# Patient Record
Sex: Female | Born: 1970 | Race: Black or African American | Hispanic: No | Marital: Married | Smoking: Never smoker
Health system: Southern US, Community
[De-identification: ages and names within clinical notes are randomized; demographics above are authoritative.]

## PROBLEM LIST (undated history)

## (undated) DIAGNOSIS — I10 Essential (primary) hypertension: Secondary | ICD-10-CM

## (undated) DIAGNOSIS — D649 Anemia, unspecified: Secondary | ICD-10-CM

## (undated) HISTORY — PX: UTERINE FIBROID SURGERY: SHX826

## (undated) HISTORY — PX: EXPLORATORY LAPAROTOMY: SUR591

## (undated) HISTORY — DX: Anemia, unspecified: D64.9

## (undated) HISTORY — PX: OVARIAN CYST REMOVAL: SHX89

---

## 1992-10-16 HISTORY — PX: OTHER SURGICAL HISTORY: SHX169

## 2000-06-08 ENCOUNTER — Emergency Department (HOSPITAL_COMMUNITY): Admission: EM | Admit: 2000-06-08 | Discharge: 2000-06-08 | Payer: Self-pay | Admitting: Emergency Medicine

## 2002-09-17 ENCOUNTER — Other Ambulatory Visit: Admission: RE | Admit: 2002-09-17 | Discharge: 2002-09-17 | Payer: Self-pay | Admitting: Gynecology

## 2002-10-07 ENCOUNTER — Ambulatory Visit (HOSPITAL_COMMUNITY): Admission: RE | Admit: 2002-10-07 | Discharge: 2002-10-07 | Payer: Self-pay | Admitting: Obstetrics & Gynecology

## 2002-10-07 ENCOUNTER — Encounter: Payer: Self-pay | Admitting: Obstetrics & Gynecology

## 2002-11-25 ENCOUNTER — Encounter: Payer: Self-pay | Admitting: Obstetrics & Gynecology

## 2002-11-25 ENCOUNTER — Ambulatory Visit (HOSPITAL_COMMUNITY): Admission: RE | Admit: 2002-11-25 | Discharge: 2002-11-25 | Payer: Self-pay | Admitting: Obstetrics & Gynecology

## 2002-12-07 ENCOUNTER — Inpatient Hospital Stay (HOSPITAL_COMMUNITY): Admission: AD | Admit: 2002-12-07 | Discharge: 2002-12-07 | Payer: Self-pay | Admitting: Obstetrics

## 2002-12-11 ENCOUNTER — Encounter: Payer: Self-pay | Admitting: Obstetrics & Gynecology

## 2002-12-11 ENCOUNTER — Ambulatory Visit (HOSPITAL_COMMUNITY): Admission: RE | Admit: 2002-12-11 | Discharge: 2002-12-11 | Payer: Self-pay | Admitting: Obstetrics & Gynecology

## 2003-02-24 ENCOUNTER — Ambulatory Visit (HOSPITAL_COMMUNITY): Admission: RE | Admit: 2003-02-24 | Discharge: 2003-02-24 | Payer: Self-pay | Admitting: Obstetrics & Gynecology

## 2003-02-24 ENCOUNTER — Encounter: Payer: Self-pay | Admitting: Obstetrics & Gynecology

## 2003-04-17 ENCOUNTER — Inpatient Hospital Stay (HOSPITAL_COMMUNITY): Admission: RE | Admit: 2003-04-17 | Discharge: 2003-04-20 | Payer: Self-pay | Admitting: Obstetrics & Gynecology

## 2003-04-21 ENCOUNTER — Encounter: Admission: RE | Admit: 2003-04-21 | Discharge: 2003-05-21 | Payer: Self-pay | Admitting: Obstetrics & Gynecology

## 2003-05-22 ENCOUNTER — Encounter: Admission: RE | Admit: 2003-05-22 | Discharge: 2003-06-21 | Payer: Self-pay | Admitting: Obstetrics & Gynecology

## 2003-06-22 ENCOUNTER — Encounter: Admission: RE | Admit: 2003-06-22 | Discharge: 2003-07-22 | Payer: Self-pay | Admitting: Obstetrics & Gynecology

## 2003-08-22 ENCOUNTER — Encounter: Admission: RE | Admit: 2003-08-22 | Discharge: 2003-09-21 | Payer: Self-pay | Admitting: Obstetrics & Gynecology

## 2003-10-22 ENCOUNTER — Encounter: Admission: RE | Admit: 2003-10-22 | Discharge: 2003-11-21 | Payer: Self-pay | Admitting: Obstetrics & Gynecology

## 2003-11-22 ENCOUNTER — Encounter: Admission: RE | Admit: 2003-11-22 | Discharge: 2003-12-22 | Payer: Self-pay | Admitting: Obstetrics & Gynecology

## 2005-12-25 ENCOUNTER — Ambulatory Visit (HOSPITAL_COMMUNITY): Admission: RE | Admit: 2005-12-25 | Discharge: 2005-12-25 | Payer: Self-pay | Admitting: Obstetrics & Gynecology

## 2007-12-02 ENCOUNTER — Inpatient Hospital Stay (HOSPITAL_COMMUNITY): Admission: AD | Admit: 2007-12-02 | Discharge: 2007-12-02 | Payer: Self-pay | Admitting: Obstetrics & Gynecology

## 2008-11-03 DIAGNOSIS — R109 Unspecified abdominal pain: Secondary | ICD-10-CM | POA: Insufficient documentation

## 2008-11-03 DIAGNOSIS — G43909 Migraine, unspecified, not intractable, without status migrainosus: Secondary | ICD-10-CM | POA: Insufficient documentation

## 2008-11-04 ENCOUNTER — Ambulatory Visit: Payer: Self-pay | Admitting: Gastroenterology

## 2008-11-04 DIAGNOSIS — R1033 Periumbilical pain: Secondary | ICD-10-CM | POA: Insufficient documentation

## 2008-11-05 ENCOUNTER — Ambulatory Visit: Payer: Self-pay | Admitting: Internal Medicine

## 2008-11-05 ENCOUNTER — Telehealth (INDEPENDENT_AMBULATORY_CARE_PROVIDER_SITE_OTHER): Payer: Self-pay | Admitting: *Deleted

## 2008-11-05 LAB — CONVERTED CEMR LAB
AST: 20 units/L (ref 0–37)
Albumin: 3.5 g/dL (ref 3.5–5.2)
Alkaline Phosphatase: 46 units/L (ref 39–117)
BUN: 10 mg/dL (ref 6–23)
Eosinophils Absolute: 0.1 10*3/uL (ref 0.0–0.7)
Eosinophils Relative: 1.3 % (ref 0.0–5.0)
GFR calc non Af Amer: 100 mL/min
Glucose, Bld: 104 mg/dL — ABNORMAL HIGH (ref 70–99)
HCT: 34.6 % — ABNORMAL LOW (ref 36.0–46.0)
Hemoglobin: 11.2 g/dL — ABNORMAL LOW (ref 12.0–15.0)
MCV: 79.5 fL (ref 78.0–100.0)
Monocytes Absolute: 0.4 10*3/uL (ref 0.1–1.0)
Monocytes Relative: 8.2 % (ref 3.0–12.0)
Neutro Abs: 2.9 10*3/uL (ref 1.4–7.7)
Platelets: 252 10*3/uL (ref 150–400)
Potassium: 4.1 meq/L (ref 3.5–5.1)
RDW: 15 % — ABNORMAL HIGH (ref 11.5–14.6)
Sed Rate: 31 mm/hr — ABNORMAL HIGH (ref 0–22)
Total Bilirubin: 0.6 mg/dL (ref 0.3–1.2)
WBC: 5.3 10*3/uL (ref 4.5–10.5)
hCG, Beta Chain, Quant, S: <0.5 milliintl units/mL

## 2008-11-16 ENCOUNTER — Telehealth: Payer: Self-pay | Admitting: Gastroenterology

## 2008-12-04 ENCOUNTER — Inpatient Hospital Stay (HOSPITAL_COMMUNITY): Admission: RE | Admit: 2008-12-04 | Discharge: 2008-12-07 | Payer: Self-pay | Admitting: Obstetrics & Gynecology

## 2008-12-04 ENCOUNTER — Encounter: Payer: Self-pay | Admitting: Obstetrics & Gynecology

## 2010-11-06 ENCOUNTER — Encounter: Payer: Self-pay | Admitting: Obstetrics & Gynecology

## 2011-01-31 LAB — CBC
Hemoglobin: 12 g/dL (ref 12.0–15.0)
RBC: 4.6 MIL/uL (ref 3.87–5.11)
WBC: 7 10*3/uL (ref 4.0–10.5)

## 2011-01-31 LAB — PREGNANCY, URINE: Preg Test, Ur: NEGATIVE

## 2011-02-28 NOTE — Op Note (Signed)
Shelby Hodges, Shelby Hodges         ACCOUNT NO.:  [id number]   MEDICAL RECORD NO.:  [id number]          PATIENT TYPE:  OIB   LOCATION:  9304                          FACILITY:  [facility]   PHYSICIAN:  Roseanna Rainbow, M.D.DATE OF BIRTH:  1971-03-02   DATE OF PROCEDURE:  DATE OF DISCHARGE:                               OPERATIVE REPORT   PREOPERATIVE DIAGNOSIS:  Right ovarian cyst.   POSTOPERATIVE DIAGNOSES:  Right paratubal cyst, pelvic adhesions.   PROCEDURE:  Diagnostic laparoscopy, exploratory laparotomy with lysis of  adhesions and excision of a right-sided paratubal cyst.   SURGEON:  Roseanna Rainbow, MD and Bing Neighbors. Clearance Coots, MD   ANESTHESIA:  General endotracheal.   FINDINGS:  At the time of diagnostic laparoscopy, there were adhesions  involving the omentum to the anterior abdominal wall.  There are  adhesions involving the small bowel to the anterior abdominal wall as  well as adhesions involving the sigmoid colon to the posterior wall of  the uterus obliterating the posterior cul-de-sac.  As the pelvic anatomy  could not be well visualized in the context of the above mentioned  adhesions, the decision was made to proceed with a laparotomy.  At  laparotomy, the above adhesions were noted.  There was also a 6-cm thin,  smooth-walled paratubal cyst.  There were adhesions involving the  sigmoid colon to the right adnexa.  There were also adhesions involving  the sigmoid colon to the left adnexa.  The left fallopian tube was  doubled back on itself and the distal ampullary portion of the tube was  involved in adhesions to the parietal peritoneum of the pelvis.   PROCEDURE IN DETAIL:  The patient was taken to the operating room with  an IV running.  She was given general anesthesia and placed in the  dorsal lithotomy position and prepped and draped in usual sterile  fashion.  A bivalve speculum was placed in the patient's vagina.  The  anterior lip of the cervix was  then grasped with a single-tooth  tenaculum.  A Hulka manipulator was then advanced into the uterus and  secured to the anterior lip of the cervix as a means of manipulating the  uterus.  The speculum was then removed.  After a time-out had been  completed, an infraumbilical skin incision was then made with the  scalpel and carried down sharply to the fascia.  The fascia was tented  up and entered sharply.  The parietal peritoneum was tented up and  entered sharply as well.  The Hasson trocar and sleeve were then  advanced into the abdomen.  The abdomen was then insufflated with CO2  gas.  The laparoscope was then introduced into the abdomen with the  above findings noted.  The Hasson trocar and sleeve were then removed.  The previous transverse abdominal incision was incised with a scalpel.  This incision was then carried down to the fascia.  The fascia was  incised along the length of the incision.  The superior aspect of the  fascial incision was tented up and the underlying rectus muscles  dissected off.  The inferior aspect of  the fascial incision was  manipulated in a similar fashion.  The rectus muscles were separated in  the midline.  The parietal peritoneum was tented up and entered sharply.  This incision was then extended superiorly and inferiorly.  At this  point, a loop of small bowel was noted to be scarred to the anterior  abdominal wall.  The scar tissue was divided using sharp dissection with  Metzenbaum scissors.  There was minimal deserosalization of the small  bowel.  The serosal defect was repaired in two interrupted sutures of 2-  0 silk.  Also please note that there were adhesions involving the  omentum to the anterior abdominal wall.  The omentum adhesions were  clamped with Kelly clamps and divided.  The pedicles was secured with  three sutures of 2-0 Vicryl.  The O'Connor-O'Sullivan retractor was then  placed into the incision and the bowel packed away with moist  laparotomy  sponges.  A bladder blade was placed as well.  The paratubal cyst was  then enucleated using both sharp and blunt dissection.  During the  dissection, the cyst wall was ruptured.  Kelly clamps were then placed  across the base of the cyst and the cyst wall was excised.  The pedicles  were secured with suture ligatures of 2-0 Vicryl.  The right fallopian  tube and ovary appeared grossly normal.  The above-noted bowel adhesions  to the right adnexa were divided using sharp dissection.  The sigmoid  colon adhesions involving the posterior aspect of the uterus were then  divided using sharp dissection.  Individual bleeding points on the  serosa of the uterus posteriorly were secured using the Bovie.  The  adhesions involving the ampullary portion of the left fallopian tube  were then divided as well using sharp dissection.  The pelvis was then  copiously irrigated.  Adequate hemostasis was noted.  A sheet of  Seprafilm was placed in the pelvis.  The parietal peritoneum and rectus  muscles were closed as a single layer using a running suture of 2-0  Vicryl.  The fascia was closed in a running fashion using two sutures of  0 PDS tied in the midline.  The skin was closed in a subcuticular  fashion using running suture of 3-0 Monocryl.  The fascial suture for  the periumbilical incision was then repaired with interrupted sutures of  0 Vicryl.  The skin was closed again with interrupted sutures of 3-0  Monocryl.  At the close of the procedure, the instrument and pack counts  were said to be correct x2.  The patient was taken to the PACU awake and  in stable condition.      Roseanna Rainbow, M.D.  Electronically Signed     LAJ/MEDQ  D:  12/04/2008  T:  12/05/2008  Job:  [id number]

## 2011-03-03 NOTE — Discharge Summary (Signed)
Shelby Hodges, Shelby Hodges         ACCOUNT NO.:  [id number]   MEDICAL RECORD NO.:  [id number]          PATIENT TYPE:  INP   LOCATION:  9304                          FACILITY:  [facility]   PHYSICIAN:  Roseanna Rainbow, M.D.DATE OF BIRTH:  [birth year]   DATE OF ADMISSION:  12/04/2008  DATE OF DISCHARGE:  12/07/2008                               DISCHARGE SUMMARY   CHIEF COMPLAINT:  The patient is a 40 year old with a persistent ovarian  cyst who presents for a laparoscopic ovarian cystectomy.   HISTORY OF PRESENT ILLNESS:  The patient has had serial ultrasounds with  a persistent cystic right-sided pelvic mass.   PAST MEDICAL HISTORY:  Noncontributory.   FAMILY HISTORY:  Noncontributory.   PAST SURGICAL HISTORY:  There is a history of an abdominal myomectomy  and cesarean deliveries.   PHYSICAL EXAMINATION:  VITAL SIGNS:  Stable, afebrile.  GENERAL:  No apparent distress.  ABDOMEN:  Nontender.  No organomegaly.  PELVIC:  The uterus is slightly enlarged.  The adnexa are nonpalpable  and nontender.   ASSESSMENT:  Persistent cystic lesion in the left pelvic region.  The  planned procedure is a laparoscopic ovarian cystectomy, possible  laparotomy, possible oophorectomy.   HOSPITAL COURSE:  The patient was admitted and underwent an attempted  laparoscopic procedure, which was converted to an exploratory laparotomy  with lysis of adhesions and excision of a right-sided paratubal cyst.  Please see the dictated operative summary.  Her postoperative course was  uneventful.  She was discharged home on postoperative day #3 tolerating  a regular diet.   DISCHARGE DIAGNOSIS:  Right-sided paratubal cyst, adhesions.   PROCEDURES:  Diagnostic laparoscopy, exploratory laparotomy, lysis of  adhesions, excision of a right-sided paratubal cyst.   CONDITION:  Good.   DIET:  Regular.   ACTIVITY:  Pelvic rest progressive activity.   MEDICATIONS:  Percocet.   DISPOSITION:  The patient was  to follow up in the office in 2 weeks.      Roseanna Rainbow, M.D.  Electronically Signed     LAJ/MEDQ  D:  12/25/2008  T:  12/26/2008  Job:  [id number]

## 2011-03-03 NOTE — Discharge Summary (Signed)
   NAME:  Shelby Hodges, Shelby Hodges                   ACCOUNT NO.:  [id number]   MEDICAL RECORD NO.:  [id number]                   PATIENT TYPE:  INP   LOCATION:  9147                                 [facility]ACILITY:  [facility]   PHYSICIAN:  Roseanna Rainbow, M.D.         DATE OF BIRTH:  06/23/1971   DATE OF ADMISSION:  04/17/2003  DATE OF DISCHARGE:  04/20/2003                                 DISCHARGE SUMMARY   CHIEF COMPLAINT:  The patient is a 40 year old gravida 4 para 0 with an  estimated date of confinement April 25, 2003 now at 38-6/7 weeks with a  history of multiple abdominal myomectomies and elective primary cesarean  delivery.  Please see the dictated history and physical for further details.   HOSPITAL COURSE:  The patient was admitted and underwent a primary lower  uterine flap elliptical cesarean delivery.  Please see the dictated  operative summary for further details.  On postoperative day #1 her  hemoglobin was 8.7.  The remainder of her hospital course was uneventful.  She is discharged to home on postoperative day #3 tolerating a regular diet.   DISCHARGE DIAGNOSES:  1. Term intrauterine pregnancy.  2. History of previous abdominal myomectomies.   PROCEDURE:  Primary low uterine flap elliptical cesarean delivery.   CONDITION:  Good.   DIET:  Regular.   ACTIVITY:  As per the instruction booklet.   MEDICATIONS:  Medications included Tylox, Micronor, and ferrous sulfate.   DISPOSITION:  The patient was to follow up in 6 weeks.                                               Roseanna Rainbow, M.D.    Shelby Hodges  D:  05/17/2003  T:  05/18/2003  Job:  [id number]

## 2011-03-03 NOTE — Op Note (Signed)
NAME:  Shelby Hodges, LAFEVERS                   ACCOUNT NO.:  [id number]   MEDICAL RECORD NO.:  [id number]                   PATIENT TYPE:  INP   LOCATION:  9147                                 FACILITY:  [facility]   PHYSICIAN:  Roseanna Rainbow, M.D.         DATE OF BIRTH:  05-Jul-1971   DATE OF PROCEDURE:  04/17/2003  DATE OF DISCHARGE:                                 OPERATIVE REPORT   PREOPERATIVE DIAGNOSES:  1. Intrauterine pregnancy at 4 and 6/7 weeks'.  2. History of multiple abdominal myomectomies.  3. Elective primary cesarean delivery.   POSTOPERATIVE DIAGNOSES:  1. Intrauterine pregnancy at 38 and 6/7 weeks'.  2. History of multiple abdominal myomectomies.  3. Elective primary cesarean delivery.   PROCEDURES:  Primary low transverse cesarean delivery via Pfannenstiel.   SURGEON:  Roseanna Rainbow, M.D., Kathreen Cosier, M.D.   ANESTHESIA:  Spinal.   COMPLICATIONS:  None.   ESTIMATED BLOOD LOSS:  800 cc.   FLUIDS AND URINE OUTPUT:  As per anesthesiology.   INDICATIONS FOR PROCEDURE:  The patient is a para 0 at 53 and 6/7 weeks'  with a history of multiple abdominal myomectomies who presents for elective  primary cesarean delivery.   FINDINGS:  Female infant in cephalic presentation.  Neonatology present at  delivery.  There was diffuse fibroid involvement of the uterus and adhesions  involving the uterus to the anterior abdominal wall.  The tubes and ovaries  were not visualized.   PROCEDURE:  The patient was taken to the operating room where spinal  anesthetic was administered and found to be adequate.  She was then prepped  and draped in the usual sterile fashion in the dorsal supine position with a  leftward tilt.  A Pfannenstiel skin incision was then made with the scalpel  and carried through to the underlying fascia.  The fascia was incised in the  midline and the incision extended laterally with the Mayo scissors.  The  superior aspect of the  fascial incision was then grasped with the Kocher  clamps, elevated, and the underlying rectus muscles dissected off.  Attention was then turned to the inferior aspect of this incision which was  manipulated in a similar fashion.  The rectus muscles were then separated in  the midline.  The parietal peritoneum tented up and entered sharply with the  Metzenbaum scissors.  The peritoneal incision was then extended superiorly  and inferiorly with good visualization of the bladder.  The bladder blade  was then inserted and the vesicouterine peritoneum identified, grasped with  pickups, and entered sharply with the Metzenbaum scissors.  This incision  was then extended laterally and the bladder flap created digitally.  The  bladder blade was then reinserted and the lower uterine segment incised in a  transverse fashion with the scalpel.  The uterine incision was then extended  laterally with bandage scissors.  The bladder blade was then removed and  attempt made to deliver the  infant's head.  The vacuum was then applied to  the infant's head and after three attempts the infant's head was delivered  atraumatically.  The nose and mouth were suctioned with the bulb suction.  The cord was clamped and cut.  The infant was handed off to the awaiting  neonatologists.  The placenta was then removed.  The uterus cleared of all  clots and debris.  The uterine incision was then repaired with 0 Monocryl in  a running lock fashion.  A second layer of the same suture was used to  obtain adequate hemostasis.  The gutters were cleared of all clots.  The  fascia was reapproximated with 0 PDS in a running fashion.  The skin was  closed with staples.  The patient tolerated the procedure well.  Sponge,  lap, and needle counts were correct x2.  One gram of cefazolin was given at  cord clamp.  The patient was taken to the PACU in stable condition.                                               Roseanna Rainbow,  M.D.    Judee Clara  D:  04/17/2003  T:  04/18/2003  Job:  [id number]

## 2011-03-03 NOTE — H&P (Signed)
NAME:  Shelby Hodges, Shelby Hodges                   ACCOUNT NO.:  [id number]   MEDICAL RECORD NO.:  [id number]                   PATIENT TYPE:  INP   LOCATION:  NA                                   [facility]ACILITY:  [facility]   PHYSICIAN:  Roseanna Rainbow, M.D.         DATE OF BIRTH:  Sep 11, 1971   DATE OF ADMISSION:  DATE OF DISCHARGE:                                HISTORY & PHYSICAL   CHIEF COMPLAINT:  The patient is a 40 year old gravida 4, para 0 with an  intrauterine pregnancy.  [facility] April 25, 2003 now at 51 and 6/7 weeks with  history of multiple abdominal myomectomies and elective primary cesarean  delivery.   HISTORY OF PRESENT ILLNESS:  Pregnancy problems and risks factors see above.  The patient has a history of multiple abdominal myomectomies on two  occasions with the last procedure involving removal.  The initial procedure  was performed in October 1996 and multiple fundal myomas were removed and  vertical incisions were made in the posterior aspect of the uterus.  In  1998, she underwent laparotomy with lysis of adhesions and ovarian  cystectomy.  She also has demonstrable multiple myomas as per serial  ultrasound examinations during the pregnancy.  She has had episodes of pain,  likely secondary to degeneration that resolves with conservative measures,  i.e., nonsteroidals and fluid hydration.   LABORATORY DATA:  Hemoglobin and hematocrit 12.3 and 37.4, platelets  274,000.  Blood type B positive.  RH antibody negative.  Sickle cell trait  negative.  RPR nonreactive.  Rubella immune, hepatitis B surface antigen  negative.  HIV nonreactive.  One-hour GCT 94.  Urine culture sensitivity  50,000 colonies per milliliter.  Pap test within normal limits.  Gonorrhea,  Chlamydia negative.  Most recent ultrasound on Feb 24, 2003 demonstrated a  normal amniotic fluid index with an estimated fetal weight percentile of  75th to 90th percentile.   PAST OB GYN HISTORY:  As above.  She has a  history of spontaneous abortion  and a history of an ectopic pregnancy.   PAST MEDICAL HISTORY:  She denies.   PAST SURGICAL HISTORY:  As above.   FAMILY HISTORY:  Lung cancer.   SOCIAL HISTORY:  No tobacco, ethanol or substance abuse.  She is a new  account representative and married.   MEDICATIONS:  Prenatal vitamins.   ALLERGIES:  No known drug allergies.   PHYSICAL EXAMINATION:  VITAL SIGNS:  Temperature 98.6, pulse 88, blood  pressure 115/70.  Fetal heart tones 140s.  GENERAL:  Well-developed, well-nourished in no apparent distress.  ABDOMEN:  Gravid.  Nontender.  PELVIC:  Deferred.  EXTREMITIES:  No edema. Nontender.  SKIN:  Without rash.   ASSESSMENT:  A 39 year old with an intrauterine pregnancy at 79 and 6/7  weeks with a history of multiple abdominal myomectomies.  Not a candidate  for trial of labor who presents for an elective primary cesarean delivery.  The risks, benefits and alternative forms  of management were reviewed with  the patient and informed consent was obtained.   PLAN:  The planned procedure is a primary cesarean delivery.                                                Roseanna Rainbow, M.D.    Judee Clara  D:  04/16/2003  T:  04/16/2003  Job:  [id number]

## 2011-10-18 ENCOUNTER — Other Ambulatory Visit: Payer: Self-pay | Admitting: Obstetrics & Gynecology

## 2011-10-18 DIAGNOSIS — Z1231 Encounter for screening mammogram for malignant neoplasm of breast: Secondary | ICD-10-CM

## 2011-11-14 ENCOUNTER — Ambulatory Visit (HOSPITAL_COMMUNITY): Payer: Self-pay

## 2012-01-12 ENCOUNTER — Ambulatory Visit (HOSPITAL_COMMUNITY): Payer: Self-pay

## 2012-10-21 ENCOUNTER — Other Ambulatory Visit: Payer: Self-pay | Admitting: Obstetrics & Gynecology

## 2012-10-21 DIAGNOSIS — D219 Benign neoplasm of connective and other soft tissue, unspecified: Secondary | ICD-10-CM

## 2012-10-28 ENCOUNTER — Ambulatory Visit (HOSPITAL_COMMUNITY): Payer: 59

## 2012-10-29 ENCOUNTER — Ambulatory Visit (HOSPITAL_COMMUNITY): Payer: 59

## 2012-10-31 ENCOUNTER — Ambulatory Visit (HOSPITAL_COMMUNITY)
Admission: RE | Admit: 2012-10-31 | Discharge: 2012-10-31 | Disposition: A | Discharge status: Home / Self Care | Payer: 59 | Source: Ambulatory Visit | Attending: Obstetrics & Gynecology | Admitting: Obstetrics & Gynecology

## 2012-10-31 DIAGNOSIS — N949 Unspecified condition associated with female genital organs and menstrual cycle: Secondary | ICD-10-CM | POA: Insufficient documentation

## 2012-10-31 DIAGNOSIS — D219 Benign neoplasm of connective and other soft tissue, unspecified: Secondary | ICD-10-CM

## 2012-10-31 DIAGNOSIS — N938 Other specified abnormal uterine and vaginal bleeding: Secondary | ICD-10-CM | POA: Insufficient documentation

## 2012-10-31 DIAGNOSIS — D251 Intramural leiomyoma of uterus: Secondary | ICD-10-CM | POA: Insufficient documentation

## 2013-01-01 ENCOUNTER — Institutional Professional Consult (permissible substitution): Payer: Self-pay | Admitting: Obstetrics & Gynecology

## 2013-01-08 ENCOUNTER — Ambulatory Visit (INDEPENDENT_AMBULATORY_CARE_PROVIDER_SITE_OTHER): Payer: 59 | Admitting: Obstetrics & Gynecology

## 2013-01-08 ENCOUNTER — Encounter: Payer: Self-pay | Admitting: Obstetrics & Gynecology

## 2013-01-08 VITALS — BP 122/82 | HR 89 | Temp 98.2°F | Ht 65.0 in | Wt 275.0 lb

## 2013-01-08 DIAGNOSIS — D259 Leiomyoma of uterus, unspecified: Secondary | ICD-10-CM

## 2013-01-08 DIAGNOSIS — R1900 Intra-abdominal and pelvic swelling, mass and lump, unspecified site: Secondary | ICD-10-CM | POA: Insufficient documentation

## 2013-01-08 NOTE — Progress Notes (Signed)
Subjective:     Shelby Hodges is a 42 y.o. female here for a routine exam.  Current complaints: ultrasound results. Pt had her ultrasound 10-31-12 at [facility].   Personal health questionnaire reviewed: no.   Gynecologic History Patient's last menstrual period was 12/19/2012. Contraception: none Last Pap: 09/2011 Results were: normal     The following portions of the patient's history were reviewed and updated as appropriate: past family history, past medical history, past surgical history and problem list.  Review of Systems Pertinent items are noted in HPI.    Objective:    No exam performed today, for results review.    Assessment:   Pelvic mass Uterine fibroids  Plan:    Follow up in: 2 weeks.

## 2013-01-08 NOTE — Patient Instructions (Signed)
Hysterectomy Information  A hysterectomy is a procedure where your uterus is surgically removed. It will no longer be possible to have menstrual periods or to become pregnant. The tubes and ovaries can be removed (bilateral salpingo-oopherectomy) during this surgery as well.  REASONS FOR A HYSTERECTOMY  Persistent, abnormal bleeding.  Lasting (chronic) pelvic pain or infection.  The lining of the uterus (endometrium) starts growing outside the uterus (endometriosis).  The endometrium starts growing in the muscle of the uterus (adenomyosis).  The uterus falls down into the vagina (pelvic organ prolapse).  Symptomatic uterine fibroids.  Precancerous cells.  Cervical cancer or uterine cancer. TYPES OF HYSTERECTOMIES  Supracervical hysterectomy. This type removes the top part of the uterus, but not the cervix.  Total hysterectomy. This type removes the uterus and cervix.  Radical hysterectomy. This type removes the uterus, cervix, and the fibrous tissue that holds the uterus in place in the pelvis (parametrium). WAYS A HYSTERECTOMY CAN BE PERFORMED  Abdominal hysterectomy. A large surgical cut (incision) is made in the abdomen. The uterus is removed through this incision.  Vaginal hysterectomy. An incision is made in the vagina. The uterus is removed through this incision. There are no abdominal incisions.  Conventional laparoscopic hysterectomy. A thin, lighted tube with a camera (laparoscope) is inserted into 3 or 4 small incisions in the abdomen. The uterus is cut into small pieces. The small pieces are removed through the incisions, or they are removed through the vagina.  Laparoscopic assisted vaginal hysterectomy (LAVH). Three or four small incisions are made in the abdomen. Part of the surgery is performed laparoscopically and part vaginally. The uterus is removed through the vagina.  Robot-assisted laparoscopic hysterectomy. A laparoscope is inserted into 3 or 4 small  incisions in the abdomen. A computer-controlled device is used to give the surgeon a 3D image. This allows for more precise movements of surgical instruments. The uterus is cut into small pieces and removed through the incisions or removed through the vagina. RISKS OF HYSTERECTOMY   Bleeding and risk of blood transfusion. Tell your caregiver if you do not want to receive any blood products.  Blood clots in the legs or lung.  Infection.  Injury to surrounding organs.  Anesthesia problems or side effects.  Conversion to an abdominal hysterectomy. WHAT TO EXPECT AFTER A HYSTERECTOMY  You will be given pain medicine.  You will need to have someone with you for the first 3 to 5 days after you go home.  You will need to follow up with your surgeon in 2 to 4 weeks after surgery to evaluate your progress.  You may have early menopause symptoms like hot flashes, night sweats, and insomnia.  If you had a hysterectomy for a problem that was not a cancer or a condition that could lead to cancer, then you no longer need Pap tests. However, even if you no longer need a Pap test, a regular exam is a good idea to make sure no other problems are starting. Document Released: 03/28/2001 Document Revised: 12/25/2011 Document Reviewed: 05/13/2011 [facility] Patient Information 2013 [place], [place]. Ovarian Cyst The ovaries are small organs that are on each side of the uterus. The ovaries are the organs that produce the female hormones, estrogen and progesterone. An ovarian cyst is a sac filled with fluid that can vary in its size. It is normal for a small cyst to form in women who are in the childbearing age and who have menstrual periods. This type of cyst is called  a follicle cyst that becomes an ovulation cyst (corpus luteum cyst) after it produces the women's egg. It later goes away on its own if the woman does not become pregnant. There are other kinds of ovarian cysts that may cause problems and may need  to be treated. The most serious problem is a cyst with cancer. It should be noted that menopausal women who have an ovarian cyst are at a higher risk of it being a cancer cyst. They should be evaluated very quickly, thoroughly and followed closely. This is especially true in menopausal women because of the high rate of ovarian cancer in women in menopause. CAUSES AND TYPES OF OVARIAN CYSTS:  FUNCTIONAL CYST: The follicle/corpus luteum cyst is a functional cyst that occurs every month during ovulation with the menstrual cycle. They go away with the next menstrual cycle if the woman does not get pregnant. Usually, there are no symptoms with a functional cyst.  ENDOMETRIOMA CYST: This cyst develops from the lining of the uterus tissue. This cyst gets in or on the ovary. It grows every month from the bleeding during the menstrual period. It is also called a "chocolate cyst" because it becomes filled with blood that turns brown. This cyst can cause pain in the lower abdomen during intercourse and with your menstrual period.  CYSTADENOMA CYST: This cyst develops from the cells on the outside of the ovary. They usually are not cancerous. They can get very big and cause lower abdomen pain and pain with intercourse. This type of cyst can twist on itself, cut off its blood supply and cause severe pain. It also can easily rupture and cause a lot of pain.  DERMOID CYST: This type of cyst is sometimes found in both ovaries. They are found to have different kinds of body tissue in the cyst. The tissue includes skin, teeth, hair, and/or cartilage. They usually do not have symptoms unless they get very big. Dermoid cysts are rarely cancerous.  POLYCYSTIC OVARY: This is a rare condition with hormone problems that produces many small cysts on both ovaries. The cysts are follicle-like cysts that never produce an egg and become a corpus luteum. It can cause an increase in body weight, infertility, acne, increase in body and  facial hair and lack of menstrual periods or rare menstrual periods. Many women with this problem develop type 2 diabetes. The exact cause of this problem is unknown. A polycystic ovary is rarely cancerous.  THECA LUTEIN CYST: Occurs when too much hormone (human chorionic gonadotropin) is produced and over-stimulates the ovaries to produce an egg. They are frequently seen when doctors stimulate the ovaries for invitro-fertilization (test tube babies).  LUTEOMA CYST: This cyst is seen during pregnancy. Rarely it can cause an obstruction to the birth canal during labor and delivery. They usually go away after delivery. SYMPTOMS   Pelvic pain or pressure.  Pain during sexual intercourse.  Increasing girth (swelling) of the abdomen.  Abnormal menstrual periods.  Increasing pain with menstrual periods.  You stop having menstrual periods and you are not pregnant. DIAGNOSIS  The diagnosis can be made during:  Routine or annual pelvic examination (common).  Ultrasound.  X-ray of the pelvis.  CT Scan.  MRI.  Blood tests. TREATMENT   Treatment may only be to follow the cyst monthly for 2 to 3 months with your caregiver. Many go away on their own, especially functional cysts.  May be aspirated (drained) with a long needle with ultrasound, or by laparoscopy (inserting a  tube into the pelvis through a small incision).  The whole cyst can be removed by laparoscopy.  Sometimes the cyst may need to be removed through an incision in the lower abdomen.  Hormone treatment is sometimes used to help dissolve certain cysts.  Birth control pills are sometimes used to help dissolve certain cysts. HOME CARE INSTRUCTIONS  Follow your caregiver's advice regarding:  Medicine.  Follow up visits to evaluate and treat the cyst.  You may need to come back or make an appointment with another caregiver, to find the exact cause of your cyst, if your caregiver is not a gynecologist.  Get your  yearly and recommended pelvic examinations and Pap tests.  Let your caregiver know if you have had an ovarian cyst in the past. SEEK MEDICAL CARE IF:   Your periods are late, irregular, they stop, or are painful.  Your stomach (abdomen) or pelvic pain does not go away.  Your stomach becomes larger or swollen.  You have pressure on your bladder or trouble emptying your bladder completely.  You have painful sexual intercourse.  You have feelings of fullness, pressure, or discomfort in your stomach.  You lose weight for no apparent reason.  You feel generally ill.  You become constipated.  You lose your appetite.  You develop acne.  You have an increase in body and facial hair.  You are gaining weight, without changing your exercise and eating habits.  You think you are pregnant. SEEK IMMEDIATE MEDICAL CARE IF:   You have increasing abdominal pain.  You feel sick to your stomach (nausea) and/or vomit.  You develop a fever that comes on suddenly.  You develop abdominal pain during a bowel movement.  Your menstrual periods become heavier than usual. Document Released: 10/02/2005 Document Revised: 12/25/2011 Document Reviewed: 08/05/2009 [facility] Patient Information 2013 [place], [place].

## 2013-01-09 LAB — CA 125: CA 125: 2.6 U/mL (ref 0.0–30.2)

## 2013-01-17 ENCOUNTER — Encounter: Payer: Self-pay | Admitting: Obstetrics & Gynecology

## 2013-01-23 ENCOUNTER — Ambulatory Visit: Payer: 59 | Admitting: Obstetrics & Gynecology

## 2013-02-05 ENCOUNTER — Ambulatory Visit (INDEPENDENT_AMBULATORY_CARE_PROVIDER_SITE_OTHER): Payer: 59 | Admitting: Obstetrics & Gynecology

## 2013-02-05 VITALS — BP 128/80 | HR 94 | Temp 98.7°F | Ht 65.0 in | Wt 274.0 lb

## 2013-02-05 DIAGNOSIS — R1900 Intra-abdominal and pelvic swelling, mass and lump, unspecified site: Secondary | ICD-10-CM

## 2013-02-05 NOTE — Patient Instructions (Addendum)
Unilateral Salpingo-Oophorectomy  Unilateral salpingo-oophorectomy is the removal of one fallopian tube and ovary. The fallopian tubes transport the egg from the ovary to the womb (uterus). The fallopian tube is also where the sperm and egg meet and become fertilized and move down into the uterus.   Removing one tube and ovary will not:  · Cause problems with your menstrual periods.  · Cause problems with your sex drive (libido).  · Put you into the menopause.  · Give symptoms of the menopause.  · Make you not able to get pregnant (sterile).  There are several reasons for doing a salpingo-oophorectomy:  · Infection of the tube and ovary.  · There may be scar tissue of the tube and ovary (adhesions).  · It may be necessary to remove the tube when the ovary has a cyst or tumor.  · It may have to be done when removing the uterus.  · It may have to be done when there is cancer of the tube or ovary.  LET YOUR CAREGIVER KNOW ABOUT:  · Allergies to food or medications.  · All the medications you are taking including prescription and over-the-counter herbs, eye drops and creams.  · If you are using illegal drugs or excessive alcohol.  · Your smoking habits.  · Previous problems with anesthesia including numbing medication.  · The possibility of being pregnant.  · History of blood clots or bleeding problems.  · Previous surgery.  · Any other medical or health problems.  RISKS AND COMPLICATIONS   All surgery is associated with risks. Some of these risks are:  · Injury to surrounding organs.  · Bleeding.  · Infection.  · Blood clots in the legs or lungs.  · Problems with the anesthesia.  · The surgery does not help the problem.  · Death.  BEFORE THE PROCEDURE  · Do not take aspirin or blood thinners because it can make you bleed.  · Do not eat or drink anything at least 8 hours before the surgery.  · Let your caregiver know if you develop a cold or an infection.  · If you are being admitted the day of surgery, arrive at least  one hour before the surgery.  · Arrange for help when you go home from the hospital.  · If you smoke, do not smoke for at least 2 weeks before the surgery.  PROCEDURE  After being admitted to the hospital, you will change into a hospital gown. Then, you will be given an IV (intravenous) and a medication to relax you. Then, you will be put to sleep with an anesthetic. Any hair on your lower belly (abdomen) will be removed, and a catheter will be placed in your bladder. The fallopian tube and ovary will be removed either through 2 very small cuts (incisions) or through large incision in the lower abdomen. The blood vessels will be clamped and tied.  AFTER THE PROCEDURE  · You will be taken to the recovery room for 1 to 3 hours until your blood pressure, pulse and temperature are stable and you are waking up.  · If you had a laparoscopy, you may be discharged in several hours.  · If you had a large incision, you will be admitted to the hospital for a day or two.  · If you had a laparoscopy, you may have shoulder pain. This is not unusual. It is from air that is left in the abdomen and affects the nerve that goes from the   diaphragm to the shoulder. It goes away in a day or two.  · You will be given pain medication as necessary.  · The intravenous and catheter will be removed before you are discharged.  · Have someone available to take you home.  HOME CARE INSTRUCTIONS   · It is normal to be sore for a week or two. Call your caregiver if the pain is getting worse or the pain medication is not helping.  · Have help when you go home for a week or so to help with the household chores.  · Follow your caregiver's advice regarding diet.  · Get rest and sleep.  · Only take over-the-counter or prescription medicines for pain or discomfort as directed by your caregiver.  · Do not take aspirin. It can cause bleeding.  · Do not drive, exercise or lift anything over 5 pounds.  · Do not drink alcohol until your caregiver gives you  permission.  · Do not lift anything over 5 pounds.  · Do not have sexual intercourse until your caregiver says it is OK.  · Take your temperature twice a day and write it down.  · Change the bandage (dressing) as directed.  · Make and keep your follow-up appointments for postoperative care.  · If you become constipated, ask your caregiver about taking a mild laxative. Drinking more liquids than usual and eating bran foods can help prevent constipation.  SEEK MEDICAL CARE IF:   · You have swelling or redness around the cut (incision).  · You develop a rash.  · You have side effects from the medication.  · You feel lightheaded.  · You need more or stronger medication.  · You have pain, swelling or redness where the IV (intravenous) was placed.  SEEK IMMEDIATE MEDICAL CARE IF:   · You develop an unexplained temperature above 100° F (37.8° C).  · You develop increasing belly (abdominal) pain.  · You have pus coming out of the incision.  · You notice a bad smell coming from the wound or dressing.  · The incision is separating.  · There is excessive vaginal bleeding.  · You start to feel sick to your stomach (nauseous) and vomit.  · You have leg or chest pain.  · You have pain when you urinate.  · You develop shortness of breath.  · You pass out.  Document Released: 07/30/2009 Document Revised: 12/25/2011 Document Reviewed: 07/30/2009  [employer]® Patient Information ©2013 [employer].

## 2013-02-05 NOTE — Progress Notes (Signed)
Subjective:     Shelby Hodges is a 42 y.o. female here for a routine exam.  Current complaints: patient is in office for consult to discuss options.    Gynecologic History Patient's last menstrual period was 01/23/2013. Contraception: none Last Pap:09/2011 . Results were: normal Last mammogram: 2014 . Results were: normal  Obstetric History OB History   Grav Para Term Preterm Abortions TAB SAB Ect Mult Living                   The following portions of the patient's history were reviewed and updated as appropriate: allergies, current medications, past family history, past medical history, past social history, past surgical history and problem list.  Review of Systems Pertinent items are noted in HPI.    Objective:     No exam     Assessment:   Pelvic mass--minimal symptoms  Plan:   Return for a pre-op visit

## 2013-02-06 ENCOUNTER — Encounter: Payer: Self-pay | Admitting: Obstetrics & Gynecology

## 2013-05-07 ENCOUNTER — Ambulatory Visit: Payer: 59 | Admitting: Obstetrics & Gynecology

## 2013-06-04 ENCOUNTER — Ambulatory Visit: Payer: 59 | Admitting: Obstetrics & Gynecology

## 2013-06-09 ENCOUNTER — Encounter: Payer: Self-pay | Admitting: Obstetrics & Gynecology

## 2013-06-09 ENCOUNTER — Other Ambulatory Visit: Payer: Self-pay | Admitting: Obstetrics & Gynecology

## 2013-06-09 ENCOUNTER — Ambulatory Visit (INDEPENDENT_AMBULATORY_CARE_PROVIDER_SITE_OTHER): Payer: 59 | Admitting: Obstetrics & Gynecology

## 2013-06-09 VITALS — BP 119/81 | HR 90 | Temp 98.6°F | Ht 66.0 in | Wt 270.0 lb

## 2013-06-09 DIAGNOSIS — N949 Unspecified condition associated with female genital organs and menstrual cycle: Secondary | ICD-10-CM

## 2013-06-09 DIAGNOSIS — R1900 Intra-abdominal and pelvic swelling, mass and lump, unspecified site: Secondary | ICD-10-CM

## 2013-06-09 NOTE — Progress Notes (Signed)
Subjective:     Shelby Hodges is a 42 y.o. female here for a routine exam.  Current complaints: follow up to an ovarian cyst. Pt states she had an ultrasound in about 2-3 months ago. Pt states she is here to discuss surgery for an ovarian cystectomy.   Personal health questionnaire reviewed: yes.   Gynecologic History Patient's last menstrual period was 05/23/2013. Contraception: none Last Pap: 2013. Results were: normal Last mammogram: 04/2012. Results were: normal  Obstetric History OB History  No data available     The following portions of the patient's history were reviewed and updated as appropriate: allergies, current medications, past family history, past medical history, past social history, past surgical history and problem list.  Review of Systems Pertinent items are noted in HPI.    Objective:     Abd: NT     Assessment:   Large, cystic adnexal mass; normal CA 125  Plan:   MRI to further delineate the pelvic mass Return for a pre op visit

## 2013-06-09 NOTE — Patient Instructions (Signed)
Ovarian Cyst  The ovaries are small organs that are on each side of the uterus. The ovaries are the organs that produce the female hormones, estrogen and progesterone. An ovarian cyst is a sac filled with fluid that can vary in its size. It is normal for a small cyst to form in women who are in the childbearing age and who have menstrual periods. This type of cyst is called a follicle cyst that becomes an ovulation cyst (corpus luteum cyst) after it produces the women's egg. It later goes away on its own if the woman does not become pregnant. There are other kinds of ovarian cysts that may cause problems and may need to be treated. The most serious problem is a cyst with cancer. It should be noted that menopausal women who have an ovarian cyst are at a higher risk of it being a cancer cyst. They should be evaluated very quickly, thoroughly and followed closely. This is especially true in menopausal women because of the high rate of ovarian cancer in women in menopause.  CAUSES AND TYPES OF OVARIAN CYSTS:   FUNCTIONAL CYST: The follicle/corpus luteum cyst is a functional cyst that occurs every month during ovulation with the menstrual cycle. They go away with the next menstrual cycle if the woman does not get pregnant. Usually, there are no symptoms with a functional cyst.   ENDOMETRIOMA CYST: This cyst develops from the lining of the uterus tissue. This cyst gets in or on the ovary. It grows every month from the bleeding during the menstrual period. It is also called a "chocolate cyst" because it becomes filled with blood that turns brown. This cyst can cause pain in the lower abdomen during intercourse and with your menstrual period.   CYSTADENOMA CYST: This cyst develops from the cells on the outside of the ovary. They usually are not cancerous. They can get very big and cause lower abdomen pain and pain with intercourse. This type of cyst can twist on itself, cut off its blood supply and cause severe pain. It  also can easily rupture and cause a lot of pain.   DERMOID CYST: This type of cyst is sometimes found in both ovaries. They are found to have different kinds of body tissue in the cyst. The tissue includes skin, teeth, hair, and/or cartilage. They usually do not have symptoms unless they get very big. Dermoid cysts are rarely cancerous.   POLYCYSTIC OVARY: This is a rare condition with hormone problems that produces many small cysts on both ovaries. The cysts are follicle-like cysts that never produce an egg and become a corpus luteum. It can cause an increase in body weight, infertility, acne, increase in body and facial hair and lack of menstrual periods or rare menstrual periods. Many women with this problem develop type 2 diabetes. The exact cause of this problem is unknown. A polycystic ovary is rarely cancerous.   THECA LUTEIN CYST: Occurs when too much hormone (human chorionic gonadotropin) is produced and over-stimulates the ovaries to produce an egg. They are frequently seen when doctors stimulate the ovaries for invitro-fertilization (test tube babies).   LUTEOMA CYST: This cyst is seen during pregnancy. Rarely it can cause an obstruction to the birth canal during labor and delivery. They usually go away after delivery.  SYMPTOMS    Pelvic pain or pressure.   Pain during sexual intercourse.   Increasing girth (swelling) of the abdomen.   Abnormal menstrual periods.   Increasing pain with menstrual periods.     You stop having menstrual periods and you are not pregnant.  DIAGNOSIS   The diagnosis can be made during:   Routine or annual pelvic examination (common).   Ultrasound.   X-ray of the pelvis.   CT Scan.   MRI.   Blood tests.  TREATMENT    Treatment may only be to follow the cyst monthly for 2 to 3 months with your caregiver. Many go away on their own, especially functional cysts.   May be aspirated (drained) with a long needle with ultrasound, or by laparoscopy (inserting a tube into  the pelvis through a small incision).   The whole cyst can be removed by laparoscopy.   Sometimes the cyst may need to be removed through an incision in the lower abdomen.   Hormone treatment is sometimes used to help dissolve certain cysts.   Birth control pills are sometimes used to help dissolve certain cysts.  HOME CARE INSTRUCTIONS   Follow your caregiver's advice regarding:   Medicine.   Follow up visits to evaluate and treat the cyst.   You may need to come back or make an appointment with another caregiver, to find the exact cause of your cyst, if your caregiver is not a gynecologist.   Get your yearly and recommended pelvic examinations and Pap tests.   Let your caregiver know if you have had an ovarian cyst in the past.  SEEK MEDICAL CARE IF:    Your periods are late, irregular, they stop, or are painful.   Your stomach (abdomen) or pelvic pain does not go away.   Your stomach becomes larger or swollen.   You have pressure on your bladder or trouble emptying your bladder completely.   You have painful sexual intercourse.   You have feelings of fullness, pressure, or discomfort in your stomach.   You lose weight for no apparent reason.   You feel generally ill.   You become constipated.   You lose your appetite.   You develop acne.   You have an increase in body and facial hair.   You are gaining weight, without changing your exercise and eating habits.   You think you are pregnant.  SEEK IMMEDIATE MEDICAL CARE IF:    You have increasing abdominal pain.   You feel sick to your stomach (nausea) and/or vomit.   You develop a fever that comes on suddenly.   You develop abdominal pain during a bowel movement.   Your menstrual periods become heavier than usual.  Document Released: 10/02/2005 Document Revised: 12/25/2011 Document Reviewed: 08/05/2009  [employer] Patient Information 2014 [employer].

## 2013-06-15 ENCOUNTER — Ambulatory Visit
Admission: RE | Admit: 2013-06-15 | Discharge: 2013-06-15 | Disposition: A | Discharge status: Home / Self Care | Payer: 59 | Source: Ambulatory Visit | Attending: Obstetrics & Gynecology | Admitting: Obstetrics & Gynecology

## 2013-06-15 ENCOUNTER — Other Ambulatory Visit: Payer: Self-pay | Admitting: Obstetrics & Gynecology

## 2013-06-15 DIAGNOSIS — N949 Unspecified condition associated with female genital organs and menstrual cycle: Secondary | ICD-10-CM

## 2013-06-19 ENCOUNTER — Ambulatory Visit (INDEPENDENT_AMBULATORY_CARE_PROVIDER_SITE_OTHER): Payer: 59 | Admitting: Obstetrics & Gynecology

## 2013-06-19 VITALS — BP 145/99 | HR 99 | Temp 98.5°F | Ht 66.0 in | Wt 270.0 lb

## 2013-06-19 DIAGNOSIS — R1900 Intra-abdominal and pelvic swelling, mass and lump, unspecified site: Secondary | ICD-10-CM

## 2013-06-19 DIAGNOSIS — D259 Leiomyoma of uterus, unspecified: Secondary | ICD-10-CM

## 2013-06-19 NOTE — Progress Notes (Signed)
Subjective:     Shelby Hodges is a 42 y.o. female here for a MRI results from 06/15/13. No current complaints:.  Personal health questionnaire reviewed: no.   Gynecologic History Patient's last menstrual period was 05/23/2013. Contraception: none Last Pap: 2013. Results were: normal Last mammogram: 2014. Results were: normal  Obstetric History OB History  No data available     The following portions of the patient's history were reviewed and updated as appropriate: allergies, current medications, past family history, past medical history, past social history, past surgical history and problem list.  Review of Systems Pertinent items are noted in HPI.    Objective:   No exam today  Assessment:   Cystic pelvic mass-- DDX adnexal vs inclusion cyst  Plan:    Exploratory laparotomy, excision of mass, possible oophorectomy, multiple myomectomies.  The risks/benefits were reviewed.

## 2013-06-22 ENCOUNTER — Encounter: Payer: Self-pay | Admitting: Obstetrics & Gynecology

## 2013-06-22 DIAGNOSIS — D259 Leiomyoma of uterus, unspecified: Secondary | ICD-10-CM | POA: Insufficient documentation

## 2013-06-22 NOTE — Patient Instructions (Signed)
Myomectomy Myoma is a non-cancerous tumor made up of fibrous tissue. It is also called leiomyoma, but more often called a fibroid tumor. Myomectomy is the removal of a fibroid tumor without removing another organ, like the uterus or ovary, with it. Fibroids range from the size of a pea to a grapefruit. They are rarely cancerous. Myomas only need treatment when they are growing or when they cause symptoms, such aspain, pressure, bleeding, and pain with intercourse. LET YOUR CAREGIVER KNOW ABOUT:  Any allergies, especially to medicines.  If you develop a cold or an infection before your surgery.  Medicines taken, including vitamins, herbs, eyedrops, over-ther-counter medicines, and creams.  Use of steroids (by mouth or creams).  Previous problems with numbing medicines.  History of blood clots or other bleeding problems.  Other health problems, such as diabetes, kidney, heart, or lung problems.  Previous surgery.  Possibility of pregnancy, if this applies. RISKS AND COMPLICATIONS   Excessive bleeding.  Infection.  Injury to other organs.  Blood clots in the legs, chest, and brain.  Scar tissue (adhesions) on other organs and in the pelvis.  Death during or after the surgery. BEFORE THE PROCEDURE  Follow your caregiver's advice regarding your surgery and preparing for surgery.  Avoid taking aspirin or blood thinners as directed by your caregiver.  DO NOT eat or drink anything after midnight on the night before surgery, or as directed by your caregiver.  DO NOT smoke (if you smoke) for 2 weeks before the surgery.  DO NOT drink alcohol the day before the surgery.  If you are admitted the day of the surgery,arrive1 hour before your surgery is scheduled.  Arrange to have someone take you home from the hospital.  Arrange to have someone care for you when you go home. PROCEDURE There are several ways to perform a myomectomy:  Hysteroscopy myomectomy. A lighted tube is  inserted inside the uterus. The tube will remove the fibroid. This is used when the fibroid is inside the cavity of the uterus.  Laparoscopic myomectomy. A long, lighted tube is inserted through 2 or 3 small incisions to see the organs in the pelvis. The fibroid is removed.  Myomectomy through a sugical cut (incicion) in the abdomen. The fibroid is removed through an incision made in the stomach. This way is performed when thethe fibroid cannot be removed with a hysteroscope or laprascope. AFTER THE PROCEDURE  If you had laparoscopic or hysteroscopic myomectomy, you may go home the same day or stay overnight.  If you had abdominal myomectomy, you may stay in the hospital a few days.  Your intravenous (IV)access tube and catheter will be removed in 1 or 2 days.  If you stay in the hospital, your caregiver will order pain medicine and a sleeping pill, if needed.  You may be placed on an antibiotic medicine, if needed.  You may be given written instructions and medicines before you are sent home. Document Released: 07/30/2007 Document Revised: 12/25/2011 Document Reviewed: 08/11/2009 [facility] Patient Information 2014 [place], [place]. Unilateral Salpingo-Oophorectomy Unilateral salpingo-oophorectomy is the removal of one fallopian tube and ovary. The fallopian tubes transport the egg from the ovary to the womb (uterus). The fallopian tube is also where the sperm and egg meet and become fertilized and move down into the uterus.  Removing one tube and ovary will not:  Cause problems with your menstrual periods.  Cause problems with your sex drive (libido).  Put you into the menopause.  Give symptoms of the menopause.  Make you not able to get pregnant (sterile). There are several reasons for doing a salpingo-oophorectomy:  Infection of the tube and ovary.  There may be scar tissue of the tube and ovary (adhesions).  It may be necessary to remove the tube when the ovary has a cyst or  tumor.  It may have to be done when removing the uterus.  It may have to be done when there is cancer of the tube or ovary. LET YOUR CAREGIVER KNOW ABOUT:  Allergies to food or medications.  All the medications you are taking including prescription and over-the-counter herbs, eye drops and creams.  If you are using illegal drugs or excessive alcohol.  Your smoking habits.  Previous problems with anesthesia including numbing medication.  The possibility of being pregnant.  History of blood clots or bleeding problems.  Previous surgery.  Any other medical or health problems. RISKS AND COMPLICATIONS  All surgery is associated with risks. Some of these risks are:  Injury to surrounding organs.  Bleeding.  Infection.  Blood clots in the legs or lungs.  Problems with the anesthesia.  The surgery does not help the problem.  Death. BEFORE THE PROCEDURE  Do not take aspirin or blood thinners because it can make you bleed.  Do not eat or drink anything at least 8 hours before the surgery.  Let your caregiver know if you develop a cold or an infection.  If you are being admitted the day of surgery, arrive at least one hour before the surgery.  Arrange for help when you go home from the hospital.  If you smoke, do not smoke for at least 2 weeks before the surgery. PROCEDURE After being admitted to the hospital, you will change into a hospital gown. Then, you will be given an IV (intravenous) and a medication to relax you. Then, you will be put to sleep with an anesthetic. Any hair on your lower belly (abdomen) will be removed, and a catheter will be placed in your bladder. The fallopian tube and ovary will be removed either through 2 very small cuts (incisions) or through large incision in the lower abdomen. The blood vessels will be clamped and tied. AFTER THE PROCEDURE  You will be taken to the recovery room for 1 to 3 hours until your blood pressure, pulse and  temperature are stable and you are waking up.  If you had a laparoscopy, you may be discharged in several hours.  If you had a large incision, you will be admitted to the hospital for a day or two.  If you had a laparoscopy, you may have shoulder pain. This is not unusual. It is from air that is left in the abdomen and affects the nerve that goes from the diaphragm to the shoulder. It goes away in a day or two.  You will be given pain medication as necessary.  The intravenous and catheter will be removed before you are discharged.  Have someone available to take you home. HOME CARE INSTRUCTIONS   It is normal to be sore for a week or two. Call your caregiver if the pain is getting worse or the pain medication is not helping.  Have help when you go home for a week or so to help with the household chores.  Follow your caregiver's advice regarding diet.  Get rest and sleep.  Only take over-the-counter or prescription medicines for pain or discomfort as directed by your caregiver.  Do not take aspirin. It can cause  bleeding.  Do not drive, exercise or lift anything over 5 pounds.  Do not drink alcohol until your caregiver gives you permission.  Do not lift anything over 5 pounds.  Do not have sexual intercourse until your caregiver says it is OK.  Take your temperature twice a day and write it down.  Change the bandage (dressing) as directed.  Make and keep your follow-up appointments for postoperative care.  If you become constipated, ask your caregiver about taking a mild laxative. Drinking more liquids than usual and eating bran foods can help prevent constipation. SEEK MEDICAL CARE IF:   You have swelling or redness around the cut (incision).  You develop a rash.  You have side effects from the medication.  You feel lightheaded.  You need more or stronger medication.  You have pain, swelling or redness where the IV (intravenous) was placed. SEEK IMMEDIATE  MEDICAL CARE IF:   You develop an unexplained temperature above 100 F (37.8 C).  You develop increasing belly (abdominal) pain.  You have pus coming out of the incision.  You notice a bad smell coming from the wound or dressing.  The incision is separating.  There is excessive vaginal bleeding.  You start to feel sick to your stomach (nauseous) and vomit.  You have leg or chest pain.  You have pain when you urinate.  You develop shortness of breath.  You pass out. Document Released: 07/30/2009 Document Revised: 12/25/2011 Document Reviewed: 07/30/2009 [facility] Patient Information 2014 [place], [place].

## 2013-06-25 ENCOUNTER — Other Ambulatory Visit (HOSPITAL_COMMUNITY): Payer: 59

## 2013-06-25 ENCOUNTER — Encounter: Payer: Self-pay | Admitting: Obstetrics & Gynecology

## 2013-06-25 ENCOUNTER — Other Ambulatory Visit: Payer: Self-pay | Admitting: *Deleted

## 2013-06-25 ENCOUNTER — Encounter (HOSPITAL_COMMUNITY): Payer: Self-pay | Admitting: Pharmacist

## 2013-06-26 ENCOUNTER — Encounter: Payer: Self-pay | Admitting: Obstetrics & Gynecology

## 2013-07-03 MED ORDER — METRONIDAZOLE IN NACL 5-0.79 MG/ML-% IV SOLN
500.0000 mg | Freq: Once | INTRAVENOUS | Status: AC
  Administered 2013-07-04: .5 g via INTRAVENOUS
  Filled 2013-07-03: qty 100

## 2013-07-03 MED ORDER — DEXTROSE 5 % IV SOLN
3.0000 g | INTRAVENOUS | Status: AC
  Administered 2013-07-04: 3 g via INTRAVENOUS
  Filled 2013-07-03: qty 3000

## 2013-07-03 NOTE — H&P (Signed)
  Subjective:  Shelby Hodges is a 42 y.o.  female.  She presented with pelvic pain 6 months ago.  Work-up at that time included a pelvic U/S that showed a cystic left adnexal mass and uterine fibroids. A CA 125 was 2.6.   The clinical course has not improved.  A recent MRI of the pelvis and abdomen showed a large, cystic left-sided pelvic mass and uterine fibroids.  Pertinent Gyn History:  Menses flow is moderate     Patient Active Problem List   Diagnosis Date Noted  . 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 Pelvic mass in female 01/08/2013  . ABDOMINAL PAIN-PERIUMBILICAL 11/04/2008  . MIGRAINE HEADACHE 11/03/2008  . ABDOMINAL PAIN 11/03/2008   Past Medical History  Diagnosis Date  . Anemia     Past Surgical History  Procedure Laterality Date  . Laproscop  1994    No prescriptions prior to admission   No Known Allergies  History  Substance Use Topics  . Smoking status: Never Smoker   . Smokeless tobacco: Not on file  . Alcohol Use: No    Family History  Problem Relation Age of Onset  . Cancer Mother   . Diabetes Maternal Grandmother      Review of Systems Pertinent items are noted in HPI.    Objective:    General appearance: alert Breasts: normal appearance, no masses or tenderness Abdomen: soft, non-tender; bowel sounds normal; no masses,  no organomegaly Pelvic: cervix normal in appearance, external genitalia normal,  uterus approximately 14 weeks size,  vagina normal without discharge         Assessment/Plan: Symptomatic pelvic mass DDX: benign ovarian cyst, peritoneal inclusion cyst Uterine fibroids    I had a lengthy discussion with the patient regarding her findings on MRI and consideration for a left salpingo oophorectomy and multiple abdominal myomectomies.   Procedure, risks, reasons, benefits and complications (including injury to bowel, bladder, major blood vessel, ureter, bleeding, possibility of transfusion, infection, or fistula  formation) were reviewed in detail. Consent was signed and preop testing was ordered.  Instructions were reviewed, including NPO after midnight.

## 2013-07-04 ENCOUNTER — Encounter (HOSPITAL_COMMUNITY): Payer: Self-pay | Admitting: Anesthesiology

## 2013-07-04 ENCOUNTER — Encounter (HOSPITAL_COMMUNITY)
Admission: RE | Disposition: A | Discharge status: Home / Self Care | Payer: Self-pay | Source: Ambulatory Visit | Attending: Obstetrics & Gynecology

## 2013-07-04 ENCOUNTER — Ambulatory Visit (HOSPITAL_COMMUNITY): Payer: 59

## 2013-07-04 ENCOUNTER — Ambulatory Visit (HOSPITAL_COMMUNITY): Payer: 59 | Admitting: Anesthesiology

## 2013-07-04 ENCOUNTER — Inpatient Hospital Stay (HOSPITAL_COMMUNITY)
Admission: RE | Admit: 2013-07-04 | Discharge: 2013-07-07 | DRG: 743 | Disposition: A | Discharge status: Home / Self Care | Payer: 59 | Source: Ambulatory Visit | Attending: Obstetrics & Gynecology | Admitting: Obstetrics & Gynecology

## 2013-07-04 ENCOUNTER — Encounter (HOSPITAL_COMMUNITY): Payer: Self-pay | Admitting: *Deleted

## 2013-07-04 DIAGNOSIS — D252 Subserosal leiomyoma of uterus: Principal | ICD-10-CM | POA: Diagnosis present

## 2013-07-04 DIAGNOSIS — N949 Unspecified condition associated with female genital organs and menstrual cycle: Secondary | ICD-10-CM | POA: Diagnosis present

## 2013-07-04 DIAGNOSIS — K668 Other specified disorders of peritoneum: Secondary | ICD-10-CM | POA: Diagnosis present

## 2013-07-04 DIAGNOSIS — R1900 Intra-abdominal and pelvic swelling, mass and lump, unspecified site: Secondary | ICD-10-CM | POA: Diagnosis present

## 2013-07-04 DIAGNOSIS — D259 Leiomyoma of uterus, unspecified: Secondary | ICD-10-CM | POA: Diagnosis present

## 2013-07-04 DIAGNOSIS — N736 Female pelvic peritoneal adhesions (postinfective): Secondary | ICD-10-CM

## 2013-07-04 DIAGNOSIS — N838 Other noninflammatory disorders of ovary, fallopian tube and broad ligament: Secondary | ICD-10-CM | POA: Diagnosis present

## 2013-07-04 HISTORY — PX: LAPAROTOMY: SHX154

## 2013-07-04 LAB — ABO/RH: ABO/RH(D): B POS

## 2013-07-04 LAB — CBC
HCT: 32.7 % — ABNORMAL LOW (ref 36.0–46.0)
Hemoglobin: 10.2 g/dL — ABNORMAL LOW (ref 12.0–15.0)
MCV: 75.9 fL — ABNORMAL LOW (ref 78.0–100.0)
RBC: 4.31 MIL/uL (ref 3.87–5.11)
WBC: 6.6 10*3/uL (ref 4.0–10.5)

## 2013-07-04 LAB — PREGNANCY, URINE: Preg Test, Ur: NEGATIVE

## 2013-07-04 SURGERY — LAPAROTOMY, EXPLORATORY
Anesthesia: General | Site: Abdomen | Laterality: Left | Wound class: Clean

## 2013-07-04 MED ORDER — KETOROLAC TROMETHAMINE 30 MG/ML IJ SOLN
INTRAMUSCULAR | Status: DC | PRN
  Administered 2013-07-04: 30 mg via INTRAVENOUS

## 2013-07-04 MED ORDER — ONDANSETRON HCL 4 MG/2ML IJ SOLN
INTRAMUSCULAR | Status: AC
  Filled 2013-07-04: qty 2

## 2013-07-04 MED ORDER — MIDAZOLAM HCL 2 MG/2ML IJ SOLN: 0.5000 mg | Freq: Once | INTRAMUSCULAR | Status: DC | PRN

## 2013-07-04 MED ORDER — PANTOPRAZOLE SODIUM 40 MG PO TBEC
40.0000 mg | DELAYED_RELEASE_TABLET | Freq: Every day | ORAL | Status: DC
  Administered 2013-07-05 – 2013-07-07 (×3): 40 mg via ORAL
  Filled 2013-07-04 (×5): qty 1

## 2013-07-04 MED ORDER — MAGNESIUM HYDROXIDE 400 MG/5ML PO SUSP
30.0000 mL | Freq: Two times a day (BID) | ORAL | Status: AC
  Administered 2013-07-05 (×2): 30 mL via ORAL
  Filled 2013-07-04 (×2): qty 30

## 2013-07-04 MED ORDER — FENTANYL CITRATE 0.05 MG/ML IJ SOLN
INTRAMUSCULAR | Status: AC
  Administered 2013-07-04: 50 ug via INTRAVENOUS
  Filled 2013-07-04: qty 2

## 2013-07-04 MED ORDER — SODIUM CHLORIDE 0.45 % IV SOLN
INTRAVENOUS | Status: DC
  Administered 2013-07-04: 23:00:00 via INTRAVENOUS

## 2013-07-04 MED ORDER — 0.9 % SODIUM CHLORIDE (POUR BTL) OPTIME
TOPICAL | Status: DC | PRN
  Administered 2013-07-04 (×2): 1000 mL

## 2013-07-04 MED ORDER — KETOROLAC TROMETHAMINE 30 MG/ML IJ SOLN: 15.0000 mg | Freq: Once | INTRAMUSCULAR | Status: DC | PRN

## 2013-07-04 MED ORDER — VASOPRESSIN 20 UNIT/ML IJ SOLN
INTRAMUSCULAR | Status: AC
  Filled 2013-07-04: qty 1

## 2013-07-04 MED ORDER — IBUPROFEN 800 MG PO TABS
800.0000 mg | ORAL_TABLET | Freq: Three times a day (TID) | ORAL | Status: DC | PRN
  Filled 2013-07-04: qty 1

## 2013-07-04 MED ORDER — MIDAZOLAM HCL 2 MG/2ML IJ SOLN
INTRAMUSCULAR | Status: DC | PRN
  Administered 2013-07-04: 2 mg via INTRAVENOUS

## 2013-07-04 MED ORDER — BUPIVACAINE LIPOSOME 1.3 % IJ SUSP
Freq: Once | INTRAMUSCULAR | Status: AC
  Administered 2013-07-04: 11:00:00
  Filled 2013-07-04: qty 20

## 2013-07-04 MED ORDER — MEPERIDINE HCL 25 MG/ML IJ SOLN: 6.2500 mg | INTRAMUSCULAR | Status: DC | PRN

## 2013-07-04 MED ORDER — ZOLPIDEM TARTRATE 5 MG PO TABS: 5.0000 mg | ORAL_TABLET | Freq: Every evening | ORAL | Status: DC | PRN

## 2013-07-04 MED ORDER — HYDROMORPHONE HCL PF 1 MG/ML IJ SOLN
INTRAMUSCULAR | Status: AC
  Filled 2013-07-04: qty 1

## 2013-07-04 MED ORDER — EPHEDRINE 5 MG/ML INJ
INTRAVENOUS | Status: AC
  Filled 2013-07-04: qty 10

## 2013-07-04 MED ORDER — LIDOCAINE HCL (CARDIAC) 20 MG/ML IV SOLN
INTRAVENOUS | Status: DC | PRN
  Administered 2013-07-04: 80 mg via INTRAVENOUS

## 2013-07-04 MED ORDER — PHENYLEPHRINE 40 MCG/ML (10ML) SYRINGE FOR IV PUSH (FOR BLOOD PRESSURE SUPPORT)
PREFILLED_SYRINGE | INTRAVENOUS | Status: AC
  Filled 2013-07-04: qty 5

## 2013-07-04 MED ORDER — MIDAZOLAM HCL 2 MG/2ML IJ SOLN
INTRAMUSCULAR | Status: AC
  Filled 2013-07-04: qty 2

## 2013-07-04 MED ORDER — GLYCOPYRROLATE 0.2 MG/ML IJ SOLN
INTRAMUSCULAR | Status: AC
  Filled 2013-07-04: qty 2

## 2013-07-04 MED ORDER — FENTANYL CITRATE 0.05 MG/ML IJ SOLN
INTRAMUSCULAR | Status: AC
  Filled 2013-07-04: qty 5

## 2013-07-04 MED ORDER — ACETAMINOPHEN 160 MG/5ML PO SOLN
ORAL | Status: AC
  Administered 2013-07-04: 975 mg via ORAL
  Filled 2013-07-04: qty 40.6

## 2013-07-04 MED ORDER — KETOROLAC TROMETHAMINE 30 MG/ML IJ SOLN
30.0000 mg | Freq: Four times a day (QID) | INTRAMUSCULAR | Status: AC
  Administered 2013-07-05: 30 mg via INTRAMUSCULAR

## 2013-07-04 MED ORDER — NEOSTIGMINE METHYLSULFATE 1 MG/ML IJ SOLN
INTRAMUSCULAR | Status: DC | PRN
  Administered 2013-07-04: 3 mg via INTRAVENOUS

## 2013-07-04 MED ORDER — LACTATED RINGERS IV SOLN
INTRAVENOUS | Status: DC
  Administered 2013-07-04 (×4): via INTRAVENOUS

## 2013-07-04 MED ORDER — SIMETHICONE 80 MG PO CHEW
80.0000 mg | CHEWABLE_TABLET | Freq: Four times a day (QID) | ORAL | Status: DC
  Administered 2013-07-04 – 2013-07-07 (×10): 80 mg via ORAL

## 2013-07-04 MED ORDER — KETOROLAC TROMETHAMINE 30 MG/ML IJ SOLN
30.0000 mg | Freq: Four times a day (QID) | INTRAMUSCULAR | Status: AC
  Filled 2013-07-04 (×2): qty 1

## 2013-07-04 MED ORDER — HYDROMORPHONE HCL PF 1 MG/ML IJ SOLN
INTRAMUSCULAR | Status: DC | PRN
  Administered 2013-07-04: 1 mg via INTRAVENOUS

## 2013-07-04 MED ORDER — EPHEDRINE SULFATE 50 MG/ML IJ SOLN
INTRAMUSCULAR | Status: DC | PRN
  Administered 2013-07-04: 10 mg via INTRAVENOUS

## 2013-07-04 MED ORDER — LIDOCAINE HCL (CARDIAC) 20 MG/ML IV SOLN
INTRAVENOUS | Status: AC
  Filled 2013-07-04: qty 5

## 2013-07-04 MED ORDER — VASOPRESSIN 20 UNIT/ML IJ SOLN
INTRAVENOUS | Status: DC | PRN
  Administered 2013-07-04: 11:00:00 via INTRAMUSCULAR

## 2013-07-04 MED ORDER — DEXAMETHASONE SODIUM PHOSPHATE 10 MG/ML IJ SOLN
INTRAMUSCULAR | Status: AC
  Filled 2013-07-04: qty 1

## 2013-07-04 MED ORDER — GLYCOPYRROLATE 0.2 MG/ML IJ SOLN
INTRAMUSCULAR | Status: DC | PRN
  Administered 2013-07-04: 0.3 mg via INTRAVENOUS
  Administered 2013-07-04: 0.1 mg via INTRAVENOUS
  Administered 2013-07-04: 0.6 mg via INTRAVENOUS

## 2013-07-04 MED ORDER — ONDANSETRON HCL 4 MG/2ML IJ SOLN
INTRAMUSCULAR | Status: DC | PRN
  Administered 2013-07-04: 4 mg via INTRAVENOUS

## 2013-07-04 MED ORDER — ONDANSETRON HCL 4 MG PO TABS: 4.0000 mg | ORAL_TABLET | Freq: Four times a day (QID) | ORAL | Status: DC | PRN

## 2013-07-04 MED ORDER — PHENYLEPHRINE HCL 10 MG/ML IJ SOLN
INTRAMUSCULAR | Status: DC | PRN
  Administered 2013-07-04: 80 ug via INTRAVENOUS
  Administered 2013-07-04: 40 ug via INTRAVENOUS
  Administered 2013-07-04: 80 ug via INTRAVENOUS

## 2013-07-04 MED ORDER — PROMETHAZINE HCL 25 MG/ML IJ SOLN: 6.2500 mg | INTRAMUSCULAR | Status: DC | PRN

## 2013-07-04 MED ORDER — FENTANYL CITRATE 0.05 MG/ML IJ SOLN
INTRAMUSCULAR | Status: DC | PRN
  Administered 2013-07-04 (×6): 50 ug via INTRAVENOUS

## 2013-07-04 MED ORDER — SODIUM CHLORIDE 0.9 % IJ SOLN
INTRAMUSCULAR | Status: DC | PRN
  Administered 2013-07-04: 20 mL

## 2013-07-04 MED ORDER — DEXAMETHASONE SODIUM PHOSPHATE 10 MG/ML IJ SOLN
INTRAMUSCULAR | Status: DC | PRN
  Administered 2013-07-04: 10 mg via INTRAVENOUS

## 2013-07-04 MED ORDER — MENTHOL 3 MG MT LOZG
1.0000 | LOZENGE | OROMUCOSAL | Status: DC | PRN
  Administered 2013-07-05: 3 mg via ORAL
  Filled 2013-07-04: qty 9

## 2013-07-04 MED ORDER — ROCURONIUM BROMIDE 100 MG/10ML IV SOLN
INTRAVENOUS | Status: DC | PRN
  Administered 2013-07-04: 10 mg via INTRAVENOUS
  Administered 2013-07-04: 35 mg via INTRAVENOUS

## 2013-07-04 MED ORDER — FENTANYL CITRATE 0.05 MG/ML IJ SOLN
25.0000 ug | INTRAMUSCULAR | Status: DC | PRN
  Administered 2013-07-04: 50 ug via INTRAVENOUS

## 2013-07-04 MED ORDER — HYDROMORPHONE HCL PF 1 MG/ML IJ SOLN: 0.2000 mg | INTRAMUSCULAR | Status: DC | PRN

## 2013-07-04 MED ORDER — ROCURONIUM BROMIDE 50 MG/5ML IV SOLN
INTRAVENOUS | Status: AC
  Filled 2013-07-04: qty 1

## 2013-07-04 MED ORDER — PROPOFOL 10 MG/ML IV EMUL
INTRAVENOUS | Status: AC
  Filled 2013-07-04: qty 20

## 2013-07-04 MED ORDER — ONDANSETRON HCL 4 MG/2ML IJ SOLN
4.0000 mg | Freq: Four times a day (QID) | INTRAMUSCULAR | Status: DC | PRN
  Administered 2013-07-04: 4 mg via INTRAVENOUS
  Filled 2013-07-04 (×2): qty 2

## 2013-07-04 MED ORDER — ACETAMINOPHEN 160 MG/5ML PO SOLN
975.0000 mg | Freq: Once | ORAL | Status: AC
  Administered 2013-07-04: 975 mg via ORAL

## 2013-07-04 MED ORDER — PROPOFOL 10 MG/ML IV BOLUS
INTRAVENOUS | Status: DC | PRN
  Administered 2013-07-04: 200 mg via INTRAVENOUS

## 2013-07-04 MED ORDER — KETOROLAC TROMETHAMINE 30 MG/ML IJ SOLN
30.0000 mg | Freq: Once | INTRAMUSCULAR | Status: AC
  Administered 2013-07-04: 30 mg via INTRAVENOUS
  Filled 2013-07-04: qty 1

## 2013-07-04 SURGICAL SUPPLY — 54 items
BRR ADH 6X5 SEPRAFILM 1 SHT (MISCELLANEOUS) ×4
CANISTER SUCTION 2500CC (MISCELLANEOUS) ×3 IMPLANT
CELLS DAT CNTRL 66122 CELL SVR (MISCELLANEOUS) IMPLANT
CHLORAPREP W/TINT 26ML (MISCELLANEOUS) ×3 IMPLANT
CLOTH BEACON ORANGE TIMEOUT ST (SAFETY) ×3 IMPLANT
CONT PATH 16OZ SNAP LID 3702 (MISCELLANEOUS) ×3 IMPLANT
CONTAINER PREFILL 10% NBF 60ML (FORM) IMPLANT
DECANTER SPIKE VIAL GLASS SM (MISCELLANEOUS) IMPLANT
DISSECTOR SPONGE CHERRY (GAUZE/BANDAGES/DRESSINGS) ×2 IMPLANT
DRAPE UNDERBUTTOCKS STRL (DRAPE) ×3 IMPLANT
DRSG OPSITE POSTOP 4X10 (GAUZE/BANDAGES/DRESSINGS) ×2 IMPLANT
GAUZE SPONGE 4X4 12PLY STRL LF (GAUZE/BANDAGES/DRESSINGS) ×2 IMPLANT
GAUZE SPONGE 4X4 16PLY XRAY LF (GAUZE/BANDAGES/DRESSINGS) ×3 IMPLANT
GLOVE BIO SURGEON STRL SZ 6.5 (GLOVE) ×3 IMPLANT
GLOVE BIO SURGEON STRL SZ8 (GLOVE) ×2 IMPLANT
GLOVE INDICATOR 7.0 STRL GRN (GLOVE) ×8 IMPLANT
GLOVE NEODERM STER SZ 7 (GLOVE) ×4 IMPLANT
GOWN PREVENTION PLUS LG XLONG (DISPOSABLE) ×13 IMPLANT
IV LACTATED RINGERS 1000ML (IV SOLUTION) ×3 IMPLANT
NDL HYPO 25X1 1.5 SAFETY (NEEDLE) IMPLANT
NEEDLE HYPO 25X1 1.5 SAFETY (NEEDLE) IMPLANT
NS IRRIG 1000ML POUR BTL (IV SOLUTION) ×3 IMPLANT
PACK ABDOMINAL GYN (CUSTOM PROCEDURE TRAY) ×3 IMPLANT
PAD MAGNETIC INST (MISCELLANEOUS) ×5 IMPLANT
PAD OB MATERNITY 4.3X12.25 (PERSONAL CARE ITEMS) ×3 IMPLANT
RETRACTOR WND ALEXIS 18 MED (MISCELLANEOUS) IMPLANT
RETRACTOR WND ALEXIS 25 LRG (MISCELLANEOUS) ×1 IMPLANT
RTRCTR WOUND ALEXIS 18CM MED (MISCELLANEOUS)
RTRCTR WOUND ALEXIS 25CM LRG (MISCELLANEOUS) ×3
SCRUB PCMX 4 OZ (MISCELLANEOUS) ×3 IMPLANT
SEPRAFILM MEMBRANE 5X6 (MISCELLANEOUS) ×4 IMPLANT
SHEET LAVH (DRAPES) ×3 IMPLANT
SPONGE LAP 18X18 X RAY DECT (DISPOSABLE) ×3 IMPLANT
STAPLER VISISTAT 35W (STAPLE) ×3 IMPLANT
SUT MNCRL AB 3-0 PS2 27 (SUTURE) IMPLANT
SUT MON AB 2-0 CT1 27 (SUTURE) ×1 IMPLANT
SUT MON AB 3-0 SH 27 (SUTURE) ×15
SUT MON AB 3-0 SH27 (SUTURE) ×6 IMPLANT
SUT PDS AB 0 CT 36 (SUTURE) ×6 IMPLANT
SUT PDS AB 0 CT1 27 (SUTURE) ×2 IMPLANT
SUT PDS AB 0 CTX 60 (SUTURE) IMPLANT
SUT PLAIN 2 0 XLH (SUTURE) ×2 IMPLANT
SUT VIC AB 0 CT1 27 (SUTURE) ×3
SUT VIC AB 0 CT1 27XBRD ANTBC (SUTURE) ×1 IMPLANT
SUT VIC AB 1 CT1 27 (SUTURE)
SUT VIC AB 1 CT1 27XBRD ANTBC (SUTURE) IMPLANT
SUT VIC AB 2-0 CT1 27 (SUTURE) ×6
SUT VIC AB 2-0 CT1 TAPERPNT 27 (SUTURE) ×2 IMPLANT
SUT VICRYL 1 TIES 12X18 (SUTURE) ×3 IMPLANT
SYR BULB IRRIGATION 50ML (SYRINGE) ×2 IMPLANT
SYR CONTROL 10ML LL (SYRINGE) IMPLANT
TOWEL OR 17X24 6PK STRL BLUE (TOWEL DISPOSABLE) ×6 IMPLANT
TRAY FOLEY CATH 14FR (SET/KITS/TRAYS/PACK) ×3 IMPLANT
WATER STERILE IRR 1000ML POUR (IV SOLUTION) ×3 IMPLANT

## 2013-07-04 NOTE — Op Note (Addendum)
Procedure Note  Indications: Pelvic mass and uterine fibroids  Pre-operative Diagnosis: See above  Post-operative Diagnosis: Peritoneal inclusion cyst, pelvic adhesions, fibroid uterus  Operation: Exploratory laparotomy, 20 minutes spent lysing adhesions, excision of cyst, multiple myomectomies  Surgeon: Antionette Char A   Assistants: Coral Ceo A  Anesthesia: General endotracheal anesthesia    Procedure Details  The patient was seen in the Holding Room. The risks, benefits, complications, treatment options, and expected outcomes were discussed with the patient.  The patient concurred with the proposed plan, giving informed consent.  The site of surgery properly noted/marked. The patient was taken to Operating Room # 4, identified as LYNDALL BELLOT and the procedure verified as Procedure(s): EXPLORATORY LAPAROTOMY LEFT OVARIAN CYSTECTOMY and Fibroid removal  with lysis of Adhesions. A Time Out was held and the above information confirmed.  Patient brought to the operating room and after satisfactory attainment of general anesthesia was placed in a modified lithotomy position in [place] stirrups. Anterior abdominal wall perineum and vagina were prepped a Foley catheter was inserted the patient was draped. Antibiotics were administered. The abdomen was entered through the prior transverse incision.  Adhesions to parieto peritoneum were divided. Peritoneal washings were obtained. A Bookwalter retractor was positioned and the small bowel packed out of the pelvis. The peritoneum was dissected from the recto sigmoid colon and left adnexa.  During the dissection the cyst was ruptured.  The adhesions involving the recto sigmoid colon and posterior uterine wall as was as the right adnexa were divided.  Adhesions involving the anterior cul de sac on the left were divided.  The myometrium overlying the posterior myoma was infiltrated with a dilute Pitressin solution.  The myometrium was  incised down to the fibroid pseudocapsule.  The fibroid was then enucleated using sharp and blunt dissection.  The defect was repaired in layers using 2 -0 Monocryl suture.  The serosa was closed with 3 -0 Monocryl suture.  The anterior myoma was excised/manipulated in a similar fashion.  Multiple sub centimeter serosal myomas were excised with cautery.  The defects were closed with interrupted 2 - 0 Monocryl suture.    The pelvis was irrigated. Adequate hemostasis was noted.  Seprafilm was placed in the pelvis, and under the peritoneal closure.  The parieto peritoneum was closed in a running fashion.  The subcutaneous tissue was irrigated and hemostasis was achieved with cautery.  A running suture of 0-Plain was placed in the subcutaneous layer and the skin closed with suture. A dressing was applied. The patient was awakened from anesthesia and taken to recovery in satisfactory condition.  An AXR was performed as the needle count could not be reconciled.  The AXR was negative for any retained needles.   Findings: Large, left-sided peritoneal inclusion with adherent sigmoid colon and left adnexa.  There were adhesions involving the sigmoid colon to the posterior uterine serosa and right adnexa.  There were small bowel adhesions to itself in an open position.  There were adhesions involving the anterior cul de sac on the left.  There multiple sub centimeter serosal myomas.  There was posterior intramural myoma approximately 3 cm in diameter.  The myoma abutted the endometrium.  The endometrial cavity was not entered.  There was a more superficial anterior, right, fundal, intramural myoma approximately 2 cm in diameter.  This myoma was necrotic and friable.  Estimated Blood Loss:  less than 100 mL         Drains: none  Total IV Fluids: per Anesthesiolgy         Specimens: Cyst wall, myomas to Pathology         Implants: none         Complications:  None; patient tolerated the procedure well.          Disposition: PACU - hemodynamically stable.         Condition: stable

## 2013-07-04 NOTE — Interval H&P Note (Signed)
History and Physical Interval Note:  07/04/2013 9:58 AM  Shelby Hodges  has presented today for surgery, with the diagnosis of PELVIC MASS FIBROIDS   The various methods of treatment have been discussed with the patient and family. After consideration of risks, benefits and other options for treatment, the patient has consented to  Procedure(s): EXPLORATORY LAPAROTOMY LEFT OVARIAN CYSTECTOMY MULTIPLY MYOMAS (Left) SALPINGO OOPHORECTOMY (Left) as a surgical intervention .  The patient's history has been reviewed, patient examined, no change in status, stable for surgery.  I have reviewed the patient's chart and labs.  Questions were answered to the patient's satisfaction.     JACKSON-MOORE,Kacie Huxtable A

## 2013-07-04 NOTE — Preoperative (Signed)
Beta Blockers   Reason not to administer Beta Blockers:Not Applicable 

## 2013-07-04 NOTE — Anesthesia Postprocedure Evaluation (Signed)
  Anesthesia Post-op Note  Patient: Shelby Hodges  Procedure(s) Performed: Procedure(s): EXPLORATORY LAPAROTOMY LEFT OVARIAN CYSTECTOMY and Fibroid removal  with lysis of Adhesions (Left)  Patient is awake and responsive. Pain and nausea are reasonably well controlled. Vital signs are stable and clinically acceptable. Oxygen saturation is clinically acceptable. There are no apparent anesthetic complications at this time. Patient is ready for discharge.

## 2013-07-04 NOTE — Anesthesia Preprocedure Evaluation (Addendum)
Anesthesia Evaluation  Patient identified by MRN, date of birth, ID band Patient awake    Reviewed: Allergy & Precautions, H&P , Patient's Chart, lab work & pertinent test results, reviewed documented beta blocker date and time   History of Anesthesia Complications Negative for: history of anesthetic complications  Airway Mallampati: III TM Distance: >3 FB Neck ROM: full    Dental no notable dental hx. (+) Teeth Intact   Pulmonary neg pulmonary ROS,  breath sounds clear to auscultation  Pulmonary exam normal       Cardiovascular Exercise Tolerance: Good negative cardio ROS  Rhythm:regular Rate:Normal     Neuro/Psych negative neurological ROS  negative psych ROS   GI/Hepatic negative GI ROS, Neg liver ROS,   Endo/Other  negative endocrine ROSMorbid obesity  Renal/GU negative Renal ROS     Musculoskeletal   Abdominal   Peds  Hematology negative hematology ROS (+) anemia ,   Anesthesia Other Findings   Reproductive/Obstetrics negative OB ROS                          Anesthesia Physical Anesthesia Plan  ASA: III  Anesthesia Plan: General ETT   Post-op Pain Management:    Induction:   Airway Management Planned:   Additional Equipment:   Intra-op Plan:   Post-operative Plan:   Informed Consent: I have reviewed the patients History and Physical, chart, labs and discussed the procedure including the risks, benefits and alternatives for the proposed anesthesia with the patient or authorized representative who has indicated his/her understanding and acceptance.   Dental Advisory Given  Plan Discussed with: CRNA and Surgeon  Anesthesia Plan Comments:         Anesthesia Quick Evaluation

## 2013-07-04 NOTE — Transfer of Care (Signed)
Immediate Anesthesia Transfer of Care Note  Patient: Shelby Hodges  Procedure(s) Performed: Procedure(s): EXPLORATORY LAPAROTOMY LEFT OVARIAN CYSTECTOMY and Fibroid removal  with lysis of Adhesions (Left)  Patient Location: PACU  Anesthesia Type:General  Level of Consciousness: awake, alert  and oriented  Airway & Oxygen Therapy: Patient Spontanous Breathing and Patient connected to nasal cannula oxygen  Post-op Assessment: Report given to PACU RN, Post -op Vital signs reviewed and stable and Patient moving all extremities  Post vital signs: Reviewed and stable  Complications: No apparent anesthesia complications

## 2013-07-05 LAB — COMPREHENSIVE METABOLIC PANEL
ALT: 8 U/L (ref 0–35)
AST: 14 U/L (ref 0–37)
Albumin: 2.6 g/dL — ABNORMAL LOW (ref 3.5–5.2)
CO2: 24 mEq/L (ref 19–32)
Chloride: 105 mEq/L (ref 96–112)
Creatinine, Ser: 0.71 mg/dL (ref 0.50–1.10)
Sodium: 138 mEq/L (ref 135–145)
Total Bilirubin: 0.4 mg/dL (ref 0.3–1.2)

## 2013-07-05 LAB — CBC
MCV: 75.6 fL — ABNORMAL LOW (ref 78.0–100.0)
Platelets: 238 10*3/uL (ref 150–400)
RBC: 3.48 MIL/uL — ABNORMAL LOW (ref 3.87–5.11)
RDW: 15.5 % (ref 11.5–15.5)
WBC: 18.4 10*3/uL — ABNORMAL HIGH (ref 4.0–10.5)

## 2013-07-05 MED ORDER — ENSURE COMPLETE PO LIQD
237.0000 mL | Freq: Two times a day (BID) | ORAL | Status: DC
  Filled 2013-07-05 (×6): qty 237

## 2013-07-05 MED ORDER — BISACODYL 10 MG RE SUPP: 10.0000 mg | Freq: Every morning | RECTAL | Status: DC

## 2013-07-05 MED ORDER — IBUPROFEN 100 MG/5ML PO SUSP
800.0000 mg | Freq: Three times a day (TID) | ORAL | Status: DC | PRN
  Administered 2013-07-05 – 2013-07-06 (×4): 800 mg via ORAL
  Administered 2013-07-07: 20 mg via ORAL
  Administered 2013-07-07: 600 mg via ORAL
  Filled 2013-07-05 (×4): qty 40

## 2013-07-05 NOTE — Anesthesia Postprocedure Evaluation (Signed)
Anesthesia Post Note  Patient: Shelby Hodges  Procedure(s) Performed: Procedure(s): EXPLORATORY LAPAROTOMY LEFT OVARIAN CYSTECTOMY and Fibroid removal  with lysis of Adhesions (Left)  Anesthesia type: General  Patient location: Women's Unit  Post pain: Pain level controlled  Post assessment: Post-op Vital signs reviewed  Last Vitals: BP 93/54  Pulse 73  Temp(Src) 36.8 C (Oral)  Resp 18  Ht 5\' 6"  (1.676 m)  Wt 270 lb (122.471 kg)  BMI 43.6 kg/m2  SpO2 98%  LMP 05/23/2013  Post vital signs: Reviewed  Level of consciousness: awake  Complications: No apparent anesthesia complications

## 2013-07-05 NOTE — Progress Notes (Signed)
Patient ID: Shelby Hodges, female   DOB: 08-06-1971, 42 y.o.   MRN: [id number] Postop day 1 Vital signs normal Bowel sounds decreased was placed on liquids today legs negative

## 2013-07-06 LAB — CBC WITH DIFFERENTIAL/PLATELET
Basophils Relative: 0 % (ref 0–1)
Eosinophils Absolute: 0.4 10*3/uL (ref 0.0–0.7)
Eosinophils Relative: 4 % (ref 0–5)
Hemoglobin: 7.9 g/dL — ABNORMAL LOW (ref 12.0–15.0)
Lymphs Abs: 2.2 10*3/uL (ref 0.7–4.0)
MCH: 24 pg — ABNORMAL LOW (ref 26.0–34.0)
MCHC: 31.3 g/dL (ref 30.0–36.0)
MCV: 76.6 fL — ABNORMAL LOW (ref 78.0–100.0)
Monocytes Relative: 9 % (ref 3–12)
RBC: 3.29 MIL/uL — ABNORMAL LOW (ref 3.87–5.11)

## 2013-07-06 MED ORDER — ACETAMINOPHEN 160 MG/5ML PO SOLN
325.0000 mg | ORAL | Status: DC | PRN
Start: 2013-07-06 — End: 2013-07-07
  Administered 2013-07-06: 325 mg via ORAL
  Filled 2013-07-06: qty 20.3

## 2013-07-06 MED ORDER — OXYCODONE HCL 5 MG/5ML PO SOLN
5.0000 mg | ORAL | Status: DC | PRN
  Administered 2013-07-06: 5 mg via ORAL
  Filled 2013-07-06: qty 5

## 2013-07-06 NOTE — Progress Notes (Signed)
Postoperative V do to postop day 2 Vital signs normal Passing flatus Doing wellitals Patient ID: Shelby Hodges, female   DOB: Nov 15, 1970, 42 y.o.   MRN:

## 2013-07-06 NOTE — Progress Notes (Signed)
Low transverse dressing has small amount serousangious drainage.  Incision well approximated.  Small areas on right lower quadrant weeping.  Honey dressing replaced and pressure dressing applied.  Will continue to monitor.

## 2013-07-07 ENCOUNTER — Encounter (HOSPITAL_COMMUNITY): Payer: Self-pay | Admitting: Obstetrics & Gynecology

## 2013-07-07 ENCOUNTER — Other Ambulatory Visit: Payer: Self-pay | Admitting: *Deleted

## 2013-07-07 MED ORDER — OXYCODONE-ACETAMINOPHEN 5-325 MG PO TABS: 1.0000 | ORAL_TABLET | ORAL | Status: DC | PRN

## 2013-07-07 MED ORDER — TRAMADOL HCL 50 MG PO TABS: 50.0000 mg | ORAL_TABLET | Freq: Four times a day (QID) | ORAL | Status: DC | PRN

## 2013-07-07 NOTE — Discharge Summary (Signed)
  Physician Discharge Summary  Patient ID: Shelby Hodges MRN: [id number] DOB/AGE: 1971/02/23 42 y.o.  Admit date: 07/04/2013 Discharge date: 07/07/2013  Admission Diagnoses: Active Problems:   Pelvic mass in female   Leiomyoma of uterus, unspecified  Discharge Diagnoses:  Active Problems:   Pelvic mass in female   Leiomyoma of uterus, unspecified   Discharged Condition: stable  Hospital Course: On 07/04/2013, the patient underwent the following: Procedure(s): EXPLORATORY LAPAROTOMY LYSIS OF ADHESIONS and Fibroid removal.   The postoperative course was uneventful.  She was discharged to home on postoperative day 3 tolerating a regular diet.  Consults: None  Significant Diagnostic Studies: none  Treatments: surgery: see above  Discharge Exam: Blood pressure 114/48, pulse 61, temperature 98.2 F (36.8 C), temperature source Oral, resp. rate 18, height 5\' 6"  (1.676 m), weight 122.471 kg (270 lb), last menstrual period 05/23/2013, SpO2 100.00%. General appearance: alert GI: soft, non-tender; bowel sounds normal; no masses,  no organomegaly Extremities: extremities normal, atraumatic, no cyanosis or edema Incision/Wound: C/D/I  Disposition: Discharge to home      Discharge Orders   Future Orders Complete By Expires   Call MD for:  extreme fatigue  As directed    Call MD for:  persistant dizziness or light-headedness  As directed    Call MD for:  persistant nausea and vomiting  As directed    Call MD for:  redness, tenderness, or signs of infection (pain, swelling, redness, odor or green/yellow discharge around incision site)  As directed    Call MD for:  severe uncontrolled pain  As directed    Call MD for:  temperature >100.4  As directed    Diet - low sodium heart healthy  As directed    Diet Carb Modified  As directed    Diet general  As directed    Discharge wound care:  As directed    Comments:     Keep clean and dry   Driving Restrictions  As directed    Comments:     No driving for 1 weeks   Increase activity slowly  As directed    Lifting restrictions  As directed    Comments:     No lifting > 5 lbs for 6 weeks   May shower / Bathe  As directed    Comments:     No tub baths for 6 weeks   May walk up steps  As directed    Sexual Activity Restrictions  As directed    Comments:     No intercourse for 6  weeks       Medication List         FUSION PLUS Caps  1 capsule daily.     oxyCODONE-acetaminophen 5-325 MG per tablet  Commonly known as:  PERCOCET  Take 1 tablet by mouth every 4 (four) hours as needed for pain.       Follow-up Information   Follow up with Antionette Char A, MD. Schedule an appointment as soon as possible for a visit in 1 week.   Specialty:  Obstetrics and Gynecology   Contact information:   [address] [place] [address] [phone]       Signed: Roseanna Rainbow 07/07/2013, 8:31 AM

## 2013-07-14 ENCOUNTER — Encounter: Payer: Self-pay | Admitting: Obstetrics & Gynecology

## 2013-07-14 ENCOUNTER — Ambulatory Visit (INDEPENDENT_AMBULATORY_CARE_PROVIDER_SITE_OTHER): Payer: 59 | Admitting: Obstetrics & Gynecology

## 2013-07-14 VITALS — BP 129/87 | HR 96 | Temp 98.2°F | Ht 66.0 in | Wt 261.0 lb

## 2013-07-14 DIAGNOSIS — Z09 Encounter for follow-up examination after completed treatment for conditions other than malignant neoplasm: Secondary | ICD-10-CM

## 2013-07-14 NOTE — Progress Notes (Signed)
.   Subjective:     Shelby Hodges is a 42 y.o. female who presents to the clinic 1 weeks status post exploratory laparotomy left ovarian cystectomy. Eating a regular diet without difficulty. Bowel movements are normal. Pain is controlled with current analgesics. Medications being used: tramadol.  The following portions of the patient's history were reviewed and updated as appropriate: allergies, current medications, past family history, past medical history, past social history, past surgical history and problem list.  Review of Systems Pertinent items are noted in HPI.    Objective:    BP 142/83  Pulse 106  Temp(Src) 98.2 F (36.8 C) (Oral)  Ht 5\' 6"  (1.676 m) General:  alert  Abdomen: soft, bowel sounds active, non-tender  Incision:   healing well, no drainage, no erythema, no hernia, no seroma, no swelling, no dehiscence, incision well approximated     Assessment:    Doing well postoperatively. Operative findings again reviewed. Pathology report discussed.    Plan:    1. Continue any current medications. 2. Wound care discussed. 3. Activity restrictions: no lifting more than 5 pounds and no intercourse 4. Anticipated return to work: 4 weeks. 5. Follow up: 4 weeks.

## 2013-07-15 ENCOUNTER — Encounter: Payer: Self-pay | Admitting: Obstetrics & Gynecology

## 2013-07-16 ENCOUNTER — Encounter: Payer: Self-pay | Admitting: Obstetrics & Gynecology

## 2013-08-13 ENCOUNTER — Ambulatory Visit (INDEPENDENT_AMBULATORY_CARE_PROVIDER_SITE_OTHER): Payer: 59 | Admitting: Obstetrics & Gynecology

## 2013-08-13 ENCOUNTER — Encounter: Payer: Self-pay | Admitting: Obstetrics & Gynecology

## 2013-08-13 VITALS — BP 131/82 | HR 84 | Temp 99.1°F | Ht 66.0 in | Wt 262.0 lb

## 2013-08-13 DIAGNOSIS — Z09 Encounter for follow-up examination after completed treatment for conditions other than malignant neoplasm: Secondary | ICD-10-CM

## 2013-08-13 LAB — POCT URINALYSIS DIPSTICK
Glucose, UA: NEGATIVE
Ketones, UA: NEGATIVE
Nitrite, UA: NEGATIVE
Spec Grav, UA: 1.015

## 2013-08-13 NOTE — Progress Notes (Signed)
Subjective:     DEAISHA WELBORN is a 42 y.o. female who presents to the clinic 6 weeks status post laparotomy for fibroids and pelvic pain. Eating a regular diet without difficulty. Bowel movements are normal. Pain is controlled with current analgesics. Medications being used: ultram.  The following portions of the patient's history were reviewed and updated as appropriate: allergies, current medications, past family history, past medical history, past social history, past surgical history and problem list.  Review of Systems Pertinent items are noted in HPI.    Objective:    BP 131/82  Pulse 84  Temp(Src) 99.1 F (37.3 C)  Ht 5\' 6"  (1.676 m)  Wt 262 lb (118.842 kg)  BMI 42.31 kg/m2  LMP 08/11/2013 General:  alert  Abdomen: soft, bowel sounds active, non-tender  Incision:   healing well, no drainage, no erythema, no hernia, no seroma, no swelling, no dehiscence, incision well approximated    SPEC: heme present; bimanual: ut slightly enlarged, NT; adnexa NT Assessment:    Doing well postoperatively.   Plan:   Return in 6 weeks.

## 2013-08-13 NOTE — Addendum Note (Signed)
Addended by: Henriette Combs on: 08/13/2013 06:22 PM   Modules accepted: Orders

## 2013-08-15 ENCOUNTER — Encounter: Payer: Self-pay | Admitting: Obstetrics & Gynecology

## 2013-08-15 ENCOUNTER — Encounter: Payer: Self-pay | Admitting: *Deleted

## 2013-08-18 ENCOUNTER — Encounter: Payer: 59 | Admitting: Obstetrics & Gynecology

## 2013-08-21 ENCOUNTER — Other Ambulatory Visit: Payer: Self-pay

## 2013-10-06 ENCOUNTER — Encounter: Payer: 59 | Admitting: Obstetrics & Gynecology

## 2013-10-14 ENCOUNTER — Encounter: Payer: Self-pay | Admitting: Obstetrics & Gynecology

## 2013-10-27 ENCOUNTER — Encounter: Payer: 59 | Admitting: Obstetrics & Gynecology

## 2014-01-22 IMAGING — CR DG ABDOMEN 1V
2 series · 2 of 2 positions shown · non-contrast
Comparison: No similar prior study is available for comparison.

CLINICAL DATA: Intraoperative image, incorrect instrument count

ABDOMEN - 1 VIEW

[view not recorded (1 of 2)]
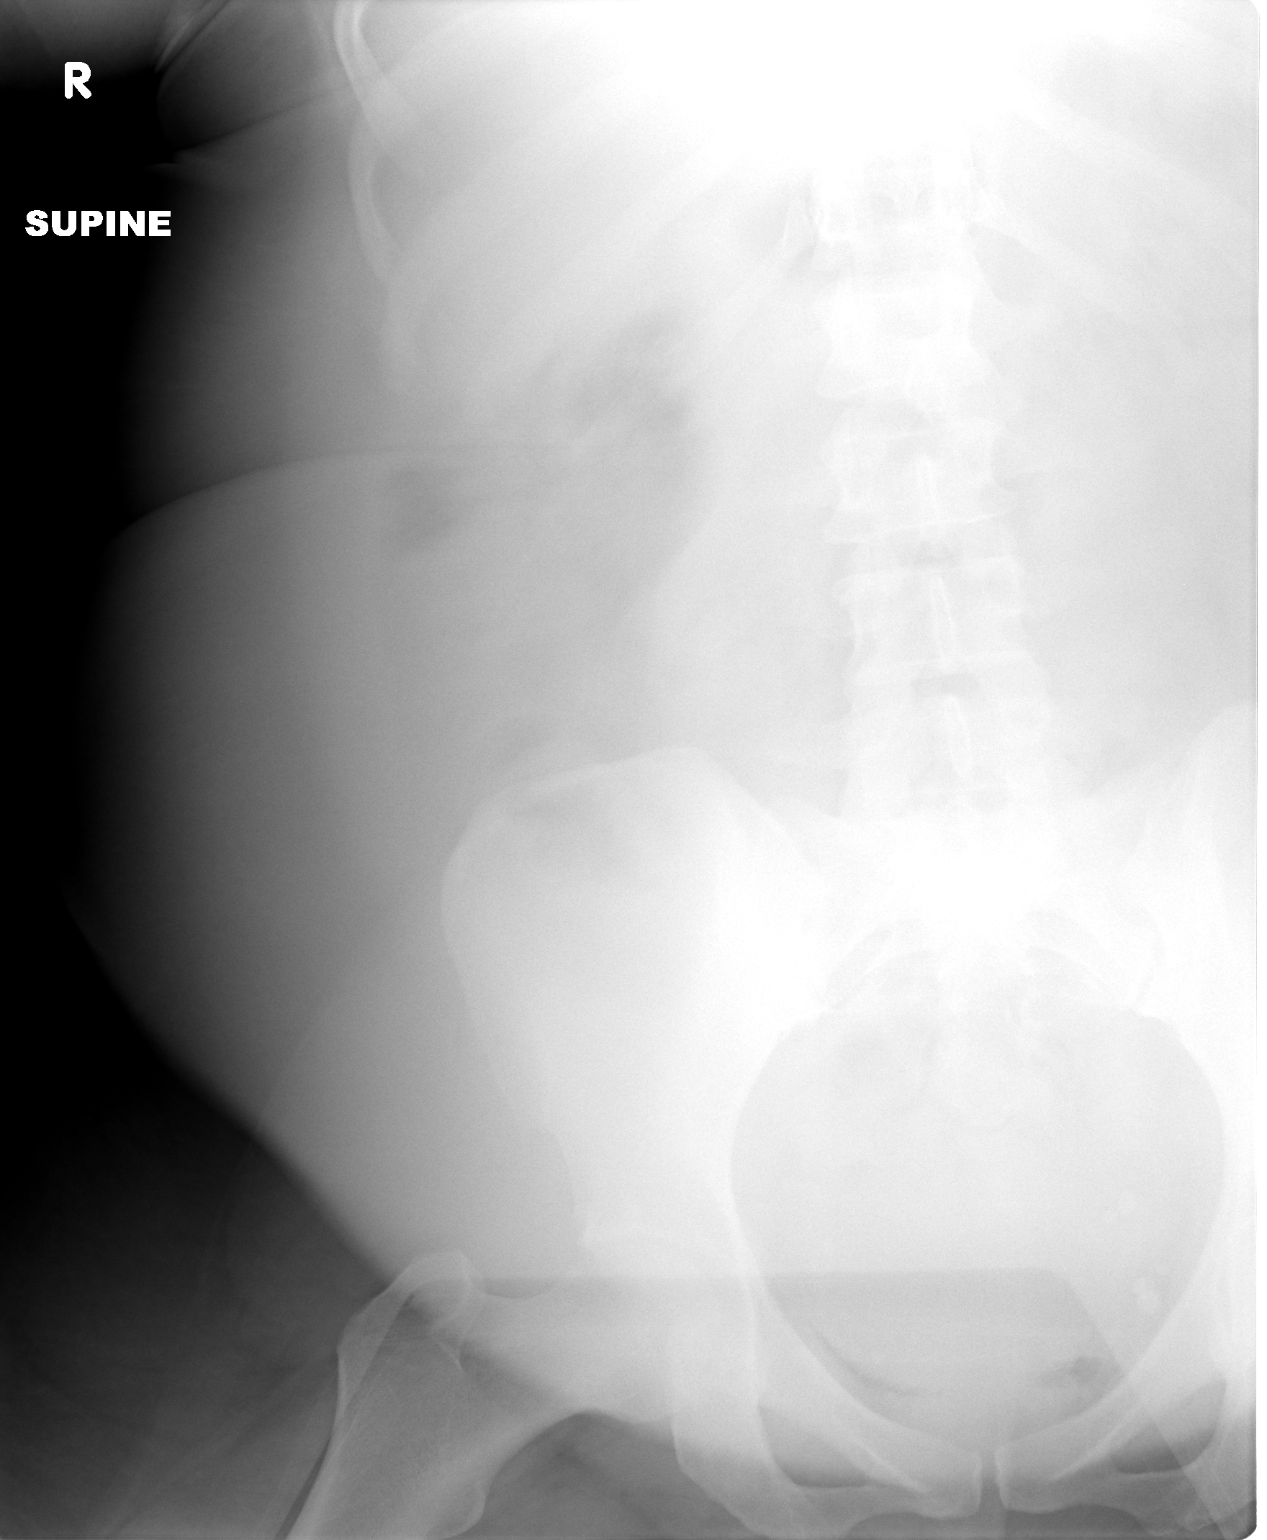

[view not recorded (2 of 2)]
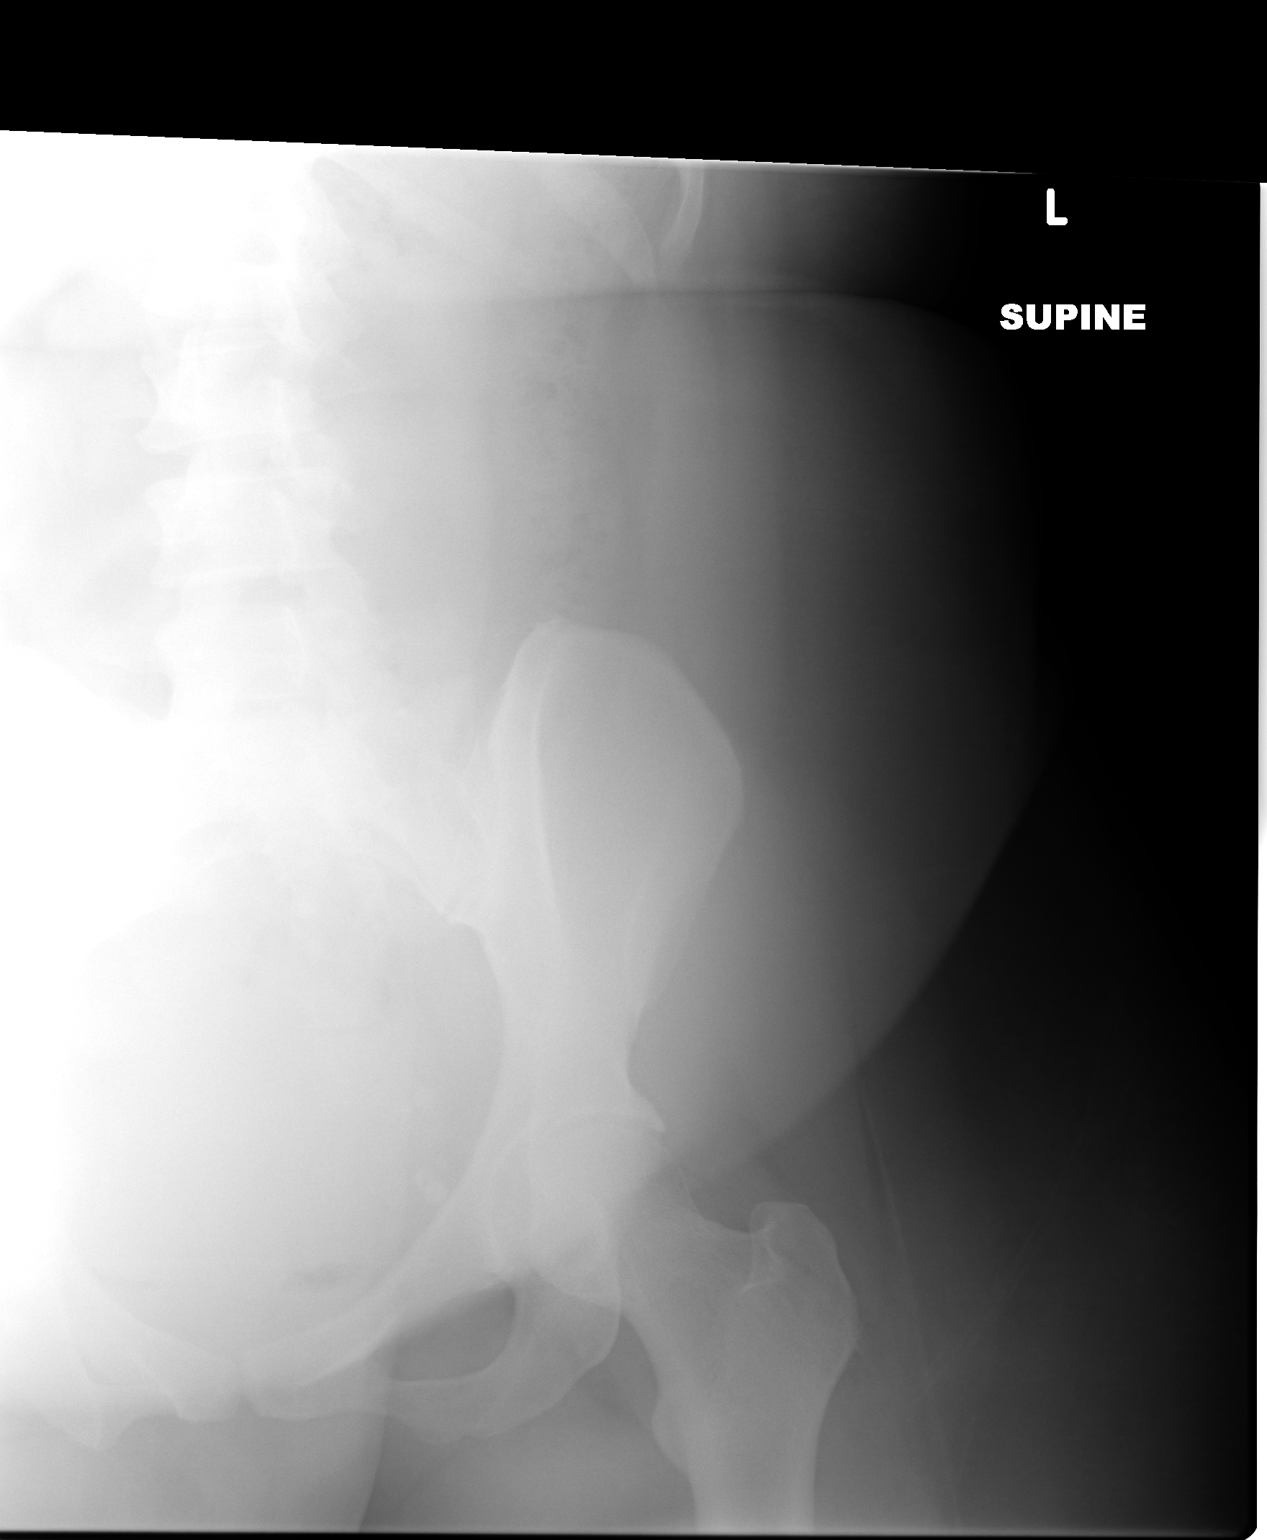

[2 of 2 positions shown; findings below may reference images not displayed]

FINDINGS: Upper abdomen omitted from the field of view.  No
radiopaque retained foreign body is identified.  Normal visualized
bowel gas pattern allowing for motion and technique.
IMPRESSION: No retained radiopaque foreign body identified.

## 2014-07-31 ENCOUNTER — Other Ambulatory Visit: Payer: Self-pay

## 2014-08-17 ENCOUNTER — Encounter: Payer: Self-pay | Admitting: Obstetrics & Gynecology

## 2014-10-12 ENCOUNTER — Encounter: Payer: Self-pay | Admitting: *Deleted

## 2014-10-13 ENCOUNTER — Encounter: Payer: Self-pay | Admitting: Obstetrics & Gynecology

## 2015-10-12 ENCOUNTER — Encounter: Payer: Self-pay | Admitting: Certified Nurse Midwife

## 2015-10-12 ENCOUNTER — Ambulatory Visit (INDEPENDENT_AMBULATORY_CARE_PROVIDER_SITE_OTHER): Payer: 59 | Admitting: Certified Nurse Midwife

## 2015-10-12 VITALS — BP 129/87 | HR 89 | Temp 98.1°F | Resp 20 | Wt 262.0 lb

## 2015-10-12 DIAGNOSIS — Z01419 Encounter for gynecological examination (general) (routine) without abnormal findings: Secondary | ICD-10-CM

## 2015-10-12 DIAGNOSIS — Z113 Encounter for screening for infections with a predominantly sexual mode of transmission: Secondary | ICD-10-CM

## 2015-10-12 NOTE — Progress Notes (Signed)
Patient ID: Shelby Hodges, female   DOB: 1971/05/19, 44 y.o.   MRN: [id number]    Subjective:      Shelby Hodges is a 44 y.o. female here for a routine exam.  Current complaints: none.  Regular periods lasting about 5 days, denies any heavy bleeding, clots or dysmenorrhea. Currently sexually active.  Not on birth control, declines any birth control.  Has mammogram scheduled for Jan. 5th, 2017.    Reports doing SBE, denies any changes.  Declines STD testing.  Denies any pain from pendulous breast tissue, reports recent  Bra fitting.    Personal health questionnaire:  Is patient Shelby Hodges, have a family history of breast and/or ovarian cancer: yes, mother had BCA dx around 55's, deceased.   Is there a family history of uterine cancer diagnosed at age < 11, gastrointestinal cancer, urinary tract cancer, family member who is a Field seismologist syndrome-associated carrier: no Is the patient overweight and hypertensive, family history of diabetes, personal history of gestational diabetes, preeclampsia or PCOS: yes Is patient over 27, have PCOS,  family history of premature CHD under age 57, diabetes, smoke, have hypertension or peripheral artery disease:  Yes, Shelby Hodges: DM At any time, has a partner hit, kicked or otherwise hurt or frightened you?: no Over the past 2 weeks, have you felt down, depressed or hopeless?: no Over the past 2 weeks, have you felt little interest or pleasure in doing things?:no   Gynecologic History Patient's last menstrual period was 09/26/2015 (exact date). Contraception: none Last Pap: unknown. Results were: normal according to the patient Last mammogram: unknown. Results were: normal according to the patient  Obstetric History OB History  Gravida Para Term Preterm AB SAB TAB Ectopic Multiple Living  4 1 1  2 2    1     # Outcome Date GA Lbr Len/2nd Weight Sex Delivery Anes PTL Lv  4 Term           3 SAB           2 SAB           1 Gravida                Past Medical History  Diagnosis Date  . Anemia     Past Surgical History  Procedure Laterality Date  . [place]  . Cesarean section  2004  . Exploratory laparotomy    . Ovarian cyst removal Left   . Uterine fibroid surgery    . Laparotomy Left 07/04/2013    Procedure: EXPLORATORY LAPAROTOMY LEFT OVARIAN CYSTECTOMY and Fibroid removal  with lysis of Adhesions;  Surgeon: Shelby Crocker, MD;  Location: [facility];  Service: Gynecology;  Laterality: Left;     Current outpatient prescriptions:  .  Iron-FA-B Cmp-C-Biot-Probiotic (FUSION PLUS) CAPS, 1 capsule daily. , Disp: , Rfl:  .  oxyCODONE-acetaminophen (PERCOCET) 5-325 MG per tablet, Take 1 tablet by mouth every 4 (four) hours as needed for pain., Disp: 30 tablet, Rfl: 0 .  traMADol (ULTRAM) 50 MG tablet, Take 1 tablet (50 mg total) by mouth every 6 (six) hours as needed for pain. (Patient not taking: Reported on 10/12/2015), Disp: 30 tablet, Rfl: 0 No Known Allergies  Social History  Substance Use Topics  . Smoking status: Never Smoker   . Smokeless tobacco: Never Used  . Alcohol Use: No    Family History  Problem Relation Age of Onset  . Cancer Mother   . Diabetes Maternal Grandmother  Review of Systems  Constitutional: negative for fatigue and weight loss Respiratory: negative for cough and wheezing Cardiovascular: negative for chest pain, fatigue and palpitations Gastrointestinal: negative for abdominal pain and change in bowel habits Musculoskeletal:negative for myalgias Neurological: negative for gait problems and tremors Behavioral/Psych: negative for abusive relationship, depression Endocrine: negative for temperature intolerance   Genitourinary:negative for abnormal menstrual periods, genital lesions, hot flashes, sexual problems and vaginal discharge Integument/breast: negative for breast lump, breast tenderness, nipple discharge and skin lesion(s)    Objective:       BP 129/87 mmHg  Pulse  89  Temp(Src) 98.1 F (36.7 C)  Resp 20  Wt 262 lb (118.842 kg)  LMP 09/26/2015 (Exact Date) General:   alert  Skin:   no rash or abnormalities  Lungs:   clear to auscultation bilaterally  Heart:   regular rate and rhythm, S1, S2 normal, no murmur, click, rub or gallop  Breasts:   normal without suspicious masses, skin or nipple changes or axillary nodes, large and pendoulous  Abdomen:  normal findings: no organomegaly, soft, non-tender and no hernia obese  Pelvis:  External genitalia: normal general appearance Urinary system: urethral meatus normal and bladder without fullness, nontender Vaginal: normal without tenderness, induration or masses Cervix: normal appearance Adnexa: normal bimanual exam Uterus: anteverted and non-tender, normal size, difficult to assess d/t body habitus   Lab Review Urine pregnancy test Labs reviewed yes Radiologic studies reviewed yes  50% of 30 min visit spent on counseling and coordination of care.   Assessment:    Healthy female exam.   Morbid obesity  H/O of ovarian cyst and fibroids  Plan:    Education reviewed: calcium supplements, depression evaluation, low fat, low cholesterol diet, safe sex/STD prevention, self breast exams, skin cancer screening and weight bearing exercise. Contraception: none. Follow up in: 1 year. Keep mammogram as scheduled   No orders of the defined types were placed in this encounter.   No orders of the defined types were placed in this encounter.    Possible management options include: surgical consult with Dr. Zigmund Daniel for hysterectomy if fibroids return/dysmenorhea.  Also, if changes her mind about breast reduction Dr. Harlow Mares.   Follow up as needed or in 1 year.

## 2015-10-12 NOTE — Addendum Note (Signed)
Addended by: Valli Glance F on: 10/12/2015 11:09 AM   Modules accepted: Orders

## 2015-10-14 LAB — PAP, TP IMAGING W/ HPV RNA, RFLX HPV TYPE 16,18/45: HPV mRNA, High Risk: NOT DETECTED

## 2015-10-15 LAB — SURESWAB, VAGINOSIS/VAGINITIS PLUS
ATOPOBIUM VAGINAE: NOT DETECTED Log (cells/mL)
BV CATEGORY: UNDETERMINED — AB
C. ALBICANS, DNA: NOT DETECTED
C. GLABRATA, DNA: NOT DETECTED
C. PARAPSILOSIS, DNA: NOT DETECTED
C. TRACHOMATIS RNA, TMA: NOT DETECTED
C. TROPICALIS, DNA: NOT DETECTED
Gardnerella vaginalis: 6.9 Log (cells/mL)
LACTOBACILLUS SPECIES: 6.9 Log (cells/mL)
MEGASPHAERA SPECIES: NOT DETECTED Log (cells/mL)
N. GONORRHOEAE RNA, TMA: NOT DETECTED
T. vaginalis RNA, QL TMA: NOT DETECTED

## 2015-10-19 ENCOUNTER — Other Ambulatory Visit: Payer: Self-pay | Admitting: Certified Nurse Midwife

## 2015-10-19 DIAGNOSIS — N760 Acute vaginitis: Principal | ICD-10-CM

## 2015-10-19 DIAGNOSIS — B9689 Other specified bacterial agents as the cause of diseases classified elsewhere: Secondary | ICD-10-CM

## 2015-10-19 MED ORDER — METRONIDAZOLE 500 MG PO TABS: 500.0000 mg | ORAL_TABLET | Freq: Two times a day (BID) | ORAL | Status: DC

## 2016-10-12 ENCOUNTER — Encounter: Payer: Self-pay | Admitting: Certified Nurse Midwife

## 2016-10-12 ENCOUNTER — Ambulatory Visit (INDEPENDENT_AMBULATORY_CARE_PROVIDER_SITE_OTHER): Payer: 59 | Admitting: Certified Nurse Midwife

## 2016-10-12 VITALS — BP 130/93 | HR 79 | Ht 66.0 in | Wt 243.0 lb

## 2016-10-12 DIAGNOSIS — Z01419 Encounter for gynecological examination (general) (routine) without abnormal findings: Secondary | ICD-10-CM

## 2016-10-12 DIAGNOSIS — Z124 Encounter for screening for malignant neoplasm of cervix: Secondary | ICD-10-CM | POA: Diagnosis not present

## 2016-10-12 DIAGNOSIS — R102 Pelvic and perineal pain: Secondary | ICD-10-CM

## 2016-10-12 DIAGNOSIS — Z0000 Encounter for general adult medical examination without abnormal findings: Secondary | ICD-10-CM | POA: Diagnosis not present

## 2016-10-12 DIAGNOSIS — Z86018 Personal history of other benign neoplasm: Secondary | ICD-10-CM

## 2016-10-12 DIAGNOSIS — Z1151 Encounter for screening for human papillomavirus (HPV): Secondary | ICD-10-CM | POA: Diagnosis not present

## 2016-10-12 DIAGNOSIS — F419 Anxiety disorder, unspecified: Secondary | ICD-10-CM

## 2016-10-12 DIAGNOSIS — R030 Elevated blood-pressure reading, without diagnosis of hypertension: Secondary | ICD-10-CM

## 2016-10-12 NOTE — Progress Notes (Signed)
Subjective:        Shelby Hodges is a 45 y.o. female here for a routine exam.  Current complaints: none.  Regular periods lasting about 5 days, denies any heavy bleeding, clots or dysmenorrhea. Currently sexually active.  Not on birth control, declines any birth control.  Has mammogram scheduled for Jan. 5th, 2018.    Reports doing SBE, denies any changes.  Declines STD testing.  Denies any pain from pendulous breast tissue, reports recent  Bra fitting. Reports recent anxiety attacks and is affecting her work and sleep; has not tried anything for them.   Has elevated blood pressure today in the office, no hx of HTN: PCP referral made.  Reports having lower left abdominal pain similar to when she had a large cyst and fibroid removal several years ago; desires to have f/u for the pain.    Personal health questionnaire:  Is patient [name], have a family history of breast and/or ovarian cancer: yes, mother had BCA dx around 69's, deceased.   Is there a family history of uterine cancer diagnosed at age < 53, gastrointestinal cancer, urinary tract cancer, family member who is a Field seismologist syndrome-associated carrier: no Is the patient overweight and hypertensive, family history of diabetes, personal history of gestational diabetes, preeclampsia or PCOS: yes Is patient over 10, have PCOS,  family history of premature CHD under age 16, diabetes, smoke, have hypertension or peripheral artery disease:  Yes, MGM: DM At any time, has a partner hit, kicked or otherwise hurt or frightened you?: no Over the past 2 weeks, have you felt down, depressed or hopeless?: no Over the past 2 weeks, have you felt little interest or pleasure in doing things?:no   Gynecologic History Patient's last menstrual period was 09/29/2016. Contraception: abstinence Last Pap: 12/16. Results were: normal Last mammogram: 1/17. Results were: normal  Obstetric History OB History  Gravida Para Term Preterm AB Living   4 1 1   2 1   SAB TAB Ectopic Multiple Live Births  2            # Outcome Date GA Lbr Len/2nd Weight Sex Delivery Anes PTL Lv  4 Term           3 SAB           2 SAB           1 Gravida               Past Medical History:  Diagnosis Date  . Anemia     Past Surgical History:  Procedure Laterality Date  . CESAREAN SECTION  2004  . EXPLORATORY LAPAROTOMY    . LAPAROTOMY Left 07/04/2013   Procedure: EXPLORATORY LAPAROTOMY LEFT OVARIAN CYSTECTOMY and Fibroid removal  with lysis of Adhesions;  Surgeon: Lahoma Crocker, MD;  Location: [facility];  Service: Gynecology;  Laterality: Left;  . [place]  . OVARIAN CYST REMOVAL Left   . UTERINE FIBROID SURGERY       Current Outpatient Prescriptions:  .  alum & mag hydroxide-simeth (MAALOX/MYLANTA) 200-200-20 MG/5ML suspension, Take by mouth every 6 (six) hours as needed for indigestion or heartburn., Disp: , Rfl:  .  calcium carbonate (TUMS - DOSED IN MG ELEMENTAL CALCIUM) 500 MG chewable tablet, Chew 1 tablet by mouth daily., Disp: , Rfl:  .  Iron-FA-B Cmp-C-Biot-Probiotic (FUSION PLUS) CAPS, 1 capsule daily. , Disp: , Rfl:  No Known Allergies  Social History  Substance Use Topics  . Smoking status:  Never Smoker  . Smokeless tobacco: Never Used  . Alcohol use No    Family History  Problem Relation Age of Onset  . Cancer Mother   . Diabetes Maternal Grandmother       Review of Systems  Constitutional: negative for fatigue and weight loss Respiratory: negative for cough and wheezing Cardiovascular: negative for chest pain, fatigue and palpitations Gastrointestinal: negative for abdominal pain and change in bowel habits Musculoskeletal:negative for myalgias Neurological: negative for gait problems and tremors Behavioral/Psych: negative for abusive relationship, depression Endocrine: negative for temperature intolerance    Genitourinary:negative for abnormal menstrual periods, genital lesions, hot flashes, sexual  problems and vaginal discharge Integument/breast: negative for breast lump, breast tenderness, nipple discharge and skin lesion(s)    Objective:       BP (!) 130/93   Pulse 79   Ht 5\' 6"  (1.676 m)   Wt 243 lb (110.2 kg)   LMP 09/29/2016   BMI 39.22 kg/m  General:   alert  Skin:   no rash or abnormalities  Lungs:   clear to auscultation bilaterally  Heart:   regular rate and rhythm, S1, S2 normal, no murmur, click, rub or gallop  Breasts:   normal without suspicious masses, skin or nipple changes or axillary nodes  Abdomen:  normal findings: no organomegaly, soft, non-tender and no hernia  Pelvis:  External genitalia: normal general appearance Urinary system: urethral meatus normal and bladder without fullness, nontender Vaginal: normal without tenderness, induration or masses Cervix: normal appearance Adnexa: normal bimanual exam Uterus: anteverted and non-tender, normal size   Lab Review Urine pregnancy test Labs reviewed yes Radiologic studies reviewed yes  50% of 30 min visit spent on counseling and coordination of care.    Assessment:    Healthy female exam.   H/O fibroids/ovarian cysts: f/u US ordered  Anxiety  Morbid obesity  Elevated blood pressure reading: PCP referral made  Plan:    Education reviewed: calcium supplements, depression evaluation, low fat, low cholesterol diet, safe sex/STD prevention, self breast exams, skin cancer screening and weight bearing exercise. Contraception: abstinence. Follow up in: 1 year. Mammogram scheduled   Meds ordered this encounter  Medications  . alum & mag hydroxide-simeth (MAALOX/MYLANTA) 200-200-20 MG/5ML suspension    Sig: Take by mouth every 6 (six) hours as needed for indigestion or heartburn.  . calcium carbonate (TUMS - DOSED IN MG ELEMENTAL CALCIUM) 500 MG chewable tablet    Sig: Chew 1 tablet by mouth daily.   No orders of the defined types were placed in this encounter.   Possible management options  include: hysterectomy, uterine ablation

## 2016-10-12 NOTE — Progress Notes (Signed)
Pt declines STD testing today.

## 2016-10-16 LAB — NUSWAB VG, CANDIDA 6SP
Candida albicans, NAA: NEGATIVE
Candida glabrata, NAA: NEGATIVE
Candida krusei, NAA: NEGATIVE
Candida lusitaniae, NAA: NEGATIVE
Candida parapsilosis, NAA: NEGATIVE
Candida tropicalis, NAA: NEGATIVE
Trich vag by NAA: NEGATIVE

## 2016-10-17 LAB — CYTOLOGY - PAP
Diagnosis: NEGATIVE
HPV: NOT DETECTED

## 2016-10-30 ENCOUNTER — Encounter: Payer: Self-pay | Admitting: *Deleted

## 2016-10-31 ENCOUNTER — Other Ambulatory Visit: Payer: 59

## 2017-10-15 ENCOUNTER — Encounter: Payer: Self-pay | Admitting: Obstetrics

## 2017-10-15 ENCOUNTER — Ambulatory Visit (INDEPENDENT_AMBULATORY_CARE_PROVIDER_SITE_OTHER): Payer: 59 | Admitting: Obstetrics

## 2017-10-15 VITALS — BP 132/73 | HR 123 | Ht 66.0 in | Wt 245.0 lb

## 2017-10-15 DIAGNOSIS — Z1151 Encounter for screening for human papillomavirus (HPV): Secondary | ICD-10-CM | POA: Diagnosis not present

## 2017-10-15 DIAGNOSIS — Z124 Encounter for screening for malignant neoplasm of cervix: Secondary | ICD-10-CM | POA: Diagnosis not present

## 2017-10-15 DIAGNOSIS — Z01419 Encounter for gynecological examination (general) (routine) without abnormal findings: Secondary | ICD-10-CM | POA: Diagnosis not present

## 2017-10-15 DIAGNOSIS — Z86018 Personal history of other benign neoplasm: Secondary | ICD-10-CM

## 2017-10-15 DIAGNOSIS — Z113 Encounter for screening for infections with a predominantly sexual mode of transmission: Secondary | ICD-10-CM | POA: Diagnosis not present

## 2017-10-15 DIAGNOSIS — Z9889 Other specified postprocedural states: Secondary | ICD-10-CM

## 2017-10-15 NOTE — Progress Notes (Signed)
Patient presents for her Annual Exam today/ pt states she has no concerns today. Declines STD screening. LMP:09/24/17 Last Mammogram :10/20/2016 WNL Next mammogram Scheduled for 10/22/2017 per pt. Last pap:10/12/2017 Pt is currently taking blood pressure medication unsure of name of medication.

## 2017-10-15 NOTE — Progress Notes (Signed)
Subjective:        Shelby Hodges is a 46 y.o. female here for a routine exam.  Current complaints: none.    Personal health questionnaire:  Is patient [name], have a family history of breast and/or ovarian cancer: no Is there a family history of uterine cancer diagnosed at age < 53, gastrointestinal cancer, urinary tract cancer, family member who is a Field seismologist syndrome-associated carrier: no Is the patient overweight and hypertensive, family history of diabetes, personal history of gestational diabetes, preeclampsia or PCOS: no Is patient over 65, have PCOS,  family history of premature CHD under age 46, diabetes, smoke, have hypertension or peripheral artery disease:  no At any time, has a partner hit, kicked or otherwise hurt or frightened you?: no Over the past 2 weeks, have you felt down, depressed or hopeless?: no Over the past 2 weeks, have you felt little interest or pleasure in doing things?:no   Gynecologic History Patient's last menstrual period was 09/24/2017 (exact date). Contraception: none Last Pap: 2017. Results were: normal Last mammogram: 2018. Results were: normal  Obstetric History OB History  Gravida Para Term Preterm AB Living  4 1 1   2 1   SAB TAB Ectopic Multiple Live Births  2            # Outcome Date GA Lbr Len/2nd Weight Sex Delivery Anes PTL Lv  4 Term           3 SAB           2 SAB           1 Gravida               Past Medical History:  Diagnosis Date  . Anemia     Past Surgical History:  Procedure Laterality Date  . CESAREAN SECTION  2004  . EXPLORATORY LAPAROTOMY    . LAPAROTOMY Left 07/04/2013   Procedure: EXPLORATORY LAPAROTOMY LEFT OVARIAN CYSTECTOMY and Fibroid removal  with lysis of Adhesions;  Surgeon: Lahoma Crocker, MD;  Location: [facility];  Service: Gynecology;  Laterality: Left;  . [place]  . OVARIAN CYST REMOVAL Left   . UTERINE FIBROID SURGERY       Current Outpatient Medications:  .  alum &  mag hydroxide-simeth (MAALOX/MYLANTA) 200-200-20 MG/5ML suspension, Take by mouth every 6 (six) hours as needed for indigestion or heartburn., Disp: , Rfl:  .  calcium carbonate (TUMS - DOSED IN MG ELEMENTAL CALCIUM) 500 MG chewable tablet, Chew 1 tablet by mouth daily., Disp: , Rfl:  .  Iron-FA-B Cmp-C-Biot-Probiotic (FUSION PLUS) CAPS, 1 capsule daily. , Disp: , Rfl:  .  lisinopril-hydrochlorothiazide (PRINZIDE,ZESTORETIC) 10-12.5 MG tablet, Take 1 tablet by mouth daily., Disp: , Rfl:  No Known Allergies  Social History   Tobacco Use  . Smoking status: Never Smoker  . Smokeless tobacco: Never Used  Substance Use Topics  . Alcohol use: No    Family History  Problem Relation Age of Onset  . Cancer Mother   . Diabetes Maternal Grandmother       Review of Systems  Constitutional: negative for fatigue and weight loss Respiratory: negative for cough and wheezing Cardiovascular: negative for chest pain, fatigue and palpitations Gastrointestinal: negative for abdominal pain and change in bowel habits Musculoskeletal:negative for myalgias Neurological: negative for gait problems and tremors Behavioral/Psych: negative for abusive relationship, depression Endocrine: negative for temperature intolerance    Genitourinary:negative for abnormal menstrual periods, genital lesions, hot flashes, sexual problems  and vaginal discharge Integument/breast: negative for breast lump, breast tenderness, nipple discharge and skin lesion(s)    Objective:       BP 132/73   Pulse (!) 123   Ht 5\' 6"  (1.676 m)   Wt 245 lb (111.1 kg)   LMP 09/24/2017 (Exact Date)   BMI 39.54 kg/m  General:   alert  Skin:   no rash or abnormalities  Lungs:   clear to auscultation bilaterally  Heart:   regular rate and rhythm, S1, S2 normal, no murmur, click, rub or gallop  Breasts:   normal without suspicious masses, skin or nipple changes or axillary nodes  Abdomen:  normal findings: no organomegaly, soft,  non-tender and no hernia  Pelvis:  External genitalia: normal general appearance Urinary system: urethral meatus normal and bladder without fullness, nontender Vaginal: normal without tenderness, induration or masses Cervix: normal appearance Adnexa: normal bimanual exam Uterus: anteverted and non-tender, normal size   Lab Review Urine pregnancy test Labs reviewed yes Radiologic studies reviewed yes  50% of 20 min visit spent on counseling and coordination of care.   Assessment:     1. Encounter for routine gynecological examination with Papanicolaou smear of cervix Rx: - Cytology - PAP - Cervicovaginal ancillary only  2. History of uterine fibroid  3. S/P myomectomy    Plan:    Education reviewed: calcium supplements, depression evaluation, low fat, low cholesterol diet, safe sex/STD prevention, self breast exams and weight bearing exercise. Contraception: none. Follow up in: 1 year.   No orders of the defined types were placed in this encounter.  No orders of the defined types were placed in this encounter.

## 2017-10-17 LAB — CERVICOVAGINAL ANCILLARY ONLY
Bacterial vaginitis: NEGATIVE
Candida vaginitis: NEGATIVE
Chlamydia: NEGATIVE
NEISSERIA GONORRHEA: NEGATIVE
Trichomonas: NEGATIVE

## 2017-10-18 LAB — CYTOLOGY - PAP
Diagnosis: NEGATIVE
HPV (WINDOPATH): NOT DETECTED

## 2018-08-24 ENCOUNTER — Ambulatory Visit (HOSPITAL_COMMUNITY)
Admission: EM | Admit: 2018-08-24 | Discharge: 2018-08-24 | Disposition: A | Discharge status: Home / Self Care | Payer: 59 | Attending: Family Medicine | Admitting: Family Medicine

## 2018-08-24 ENCOUNTER — Encounter (HOSPITAL_COMMUNITY): Payer: Self-pay | Admitting: *Deleted

## 2018-08-24 DIAGNOSIS — M654 Radial styloid tenosynovitis [de Quervain]: Secondary | ICD-10-CM

## 2018-08-24 DIAGNOSIS — M79645 Pain in left finger(s): Secondary | ICD-10-CM

## 2018-08-24 HISTORY — DX: Essential (primary) hypertension: I10

## 2018-08-24 MED ORDER — NAPROXEN 500 MG PO TABS: 500.0000 mg | ORAL_TABLET | Freq: Two times a day (BID) | ORAL | 0 refills | Status: AC

## 2018-08-24 NOTE — ED Provider Notes (Signed)
[facility]   [id number] 08/24/18 Arrival Time: 5397  CC: Left thumb pain  SUBJECTIVE: History from: patient. Shelby Hodges is a 47 y.o. female complains of left thumb pain that began 1 day ago.  Denies a precipitating event or specific injury, but speculates she may have slept on it wrong.  Localizes the pain to the base of thumb.  Describes the pain as intermittent.  Has NOT tried OTC medications.  Symptoms are made worse with flexion.  Denies similar symptoms in the past.  Denies fever, chills, erythema, ecchymosis, effusion, weakness, numbness and tingling.      ROS: As per HPI.  Past Medical History:  Diagnosis Date  . Anemia   . Hypertension    Past Surgical History:  Procedure Laterality Date  . CESAREAN SECTION  2004  . EXPLORATORY LAPAROTOMY    . LAPAROTOMY Left 07/04/2013   Procedure: EXPLORATORY LAPAROTOMY LEFT OVARIAN CYSTECTOMY and Fibroid removal  with lysis of Adhesions;  Surgeon: Lahoma Crocker, MD;  Location: [facility];  Service: Gynecology;  Laterality: Left;  . [place]  . OVARIAN CYST REMOVAL Left   . UTERINE FIBROID SURGERY     No Known Allergies No current facility-administered medications on file prior to encounter.    Current Outpatient Medications on File Prior to Encounter  Medication Sig Dispense Refill  . lisinopril-hydrochlorothiazide (PRINZIDE,ZESTORETIC) 10-12.5 MG tablet Take 1 tablet by mouth daily.    Marland Kitchen alum & mag hydroxide-simeth (MAALOX/MYLANTA) 200-200-20 MG/5ML suspension Take by mouth every 6 (six) hours as needed for indigestion or heartburn.     Social History   Socioeconomic History  . Marital status: Married    Spouse name: Not on file  . Number of children: Not on file  . Years of education: Not on file  . Highest education level: Not on file  Occupational History  . Not on file  Social Needs  . Financial resource strain: Not on file  . Food insecurity:    Worry: Not on file    Inability: Not on  file  . Transportation needs:    Medical: Not on file    Non-medical: Not on file  Tobacco Use  . Smoking status: Never Smoker  . Smokeless tobacco: Never Used  Substance and Sexual Activity  . Alcohol use: No  . Drug use: Never  . Sexual activity: Not on file  Lifestyle  . Physical activity:    Days per week: Not on file    Minutes per session: Not on file  . Stress: Not on file  Relationships  . Social connections:    Talks on phone: Not on file    Gets together: Not on file    Attends religious service: Not on file    Active member of club or organization: Not on file    Attends meetings of clubs or organizations: Not on file    Relationship status: Not on file  . Intimate partner violence:    Fear of current or ex partner: Not on file    Emotionally abused: Not on file    Physically abused: Not on file    Forced sexual activity: Not on file  Other Topics Concern  . Not on file  Social History Narrative  . Not on file   Family History  Problem Relation Age of Onset  . Cancer Mother   . Diabetes Maternal Grandmother     OBJECTIVE:  Vitals:   08/24/18 1139 08/24/18 1141  BP:  125/68  Pulse: 61   Resp: 18   Temp: 97.7 F (36.5 C)   TempSrc: Oral   SpO2: 100%     General appearance: AOx3; in no acute distress.  Head: NCAT Lungs: CTA bilaterally Heart: RRR.  Clear S1 and S2 without murmur, gallops, or rubs.  Radial pulses 2+ bilaterally. Musculoskeletal: Left hand Inspection: Skin warm, dry, clear and intact without obvious erythema, effusion, or ecchymosis.  Palpation: diffusely tender to palpation over 1st MCP joint; no snuff box tenderness ROM: FROM active; discomfort with full thumb flexion Strength: 5/5 elbow flexion, 5/5 elbow extension, 5/5 wrist flexion/extension, 5/5 grip strength +finklesteins maneuver left thumb Skin: warm and dry Neurologic: Ambulates without difficulty; Sensation intact about the upper extremities Psychological: alert and  cooperative; normal mood and affect  ASSESSMENT & PLAN:  1. De Quervain's tenosynovitis, left   2. Pain of left thumb    Meds ordered this encounter  Medications  . naproxen (NAPROSYN) 500 MG tablet    Sig: Take 1 tablet (500 mg total) by mouth 2 (two) times daily.    Dispense:  20 tablet    Refill:  0    Order Specific Question:   Supervising Provider    Answer:   Wynona Luna [[id number]]   Thumb spica placed.   Continue conservative management of rest, ice, and gentle stretches Take naproxen as needed for pain relief (may cause abdominal discomfort, ulcers, and GI bleeds avoid taking with other NSAIDs) Follow up with orthopedist if symptoms persists Return or go to the ER if you have any new or worsening symptoms (fever, chills, chest pain, abdominal pain, changes in bowel or bladder habits, pain radiating into lower legs, etc...)   Reviewed expectations re: course of current medical issues. Questions answered. Outlined signs and symptoms indicating need for more acute intervention. Patient verbalized understanding. After Visit Summary given.    Lestine Box, PA-C 08/24/18 1408

## 2018-08-24 NOTE — ED Triage Notes (Signed)
C/O pain at base of left thumb since yesterday; denies injury.

## 2018-08-24 NOTE — Discharge Instructions (Addendum)
Thumb spica placed.   Continue conservative management of rest, ice, and gentle stretches Take naproxen as needed for pain relief (may cause abdominal discomfort, ulcers, and GI bleeds avoid taking with other NSAIDs) Follow up with orthopedist if symptoms persists Return or go to the ER if you have any new or worsening symptoms (fever, chills, chest pain, abdominal pain, changes in bowel or bladder habits, pain radiating into lower legs, etc...)

## 2020-04-12 ENCOUNTER — Other Ambulatory Visit: Payer: Self-pay | Admitting: Family Medicine

## 2020-04-12 ENCOUNTER — Other Ambulatory Visit (HOSPITAL_COMMUNITY)
Admission: RE | Admit: 2020-04-12 | Discharge: 2020-04-12 | Disposition: A | Discharge status: Home / Self Care | Payer: 59 | Source: Ambulatory Visit | Attending: Family Medicine | Admitting: Family Medicine

## 2020-04-12 DIAGNOSIS — Z124 Encounter for screening for malignant neoplasm of cervix: Secondary | ICD-10-CM | POA: Insufficient documentation

## 2020-04-15 LAB — CYTOLOGY - PAP
Comment: NEGATIVE
Diagnosis: NEGATIVE
High risk HPV: NEGATIVE

## 2020-05-20 ENCOUNTER — Encounter: Payer: Self-pay | Admitting: Allergy & Immunology

## 2020-05-20 ENCOUNTER — Ambulatory Visit (INDEPENDENT_AMBULATORY_CARE_PROVIDER_SITE_OTHER): Payer: 59 | Admitting: Allergy & Immunology

## 2020-05-20 ENCOUNTER — Other Ambulatory Visit: Payer: Self-pay

## 2020-05-20 VITALS — BP 146/90 | HR 78 | Temp 98.5°F | Resp 18 | Ht 65.0 in | Wt 276.0 lb

## 2020-05-20 DIAGNOSIS — T7800XD Anaphylactic reaction due to unspecified food, subsequent encounter: Secondary | ICD-10-CM

## 2020-05-20 NOTE — Progress Notes (Signed)
NEW PATIENT  Date of Service/Encounter:  05/20/20  Referring provider: Caren Macadam, MD   Assessment:   Anaphylactic shock due to food (shellfish) - needs shellfish challenge  Plan/Recommendations:   1. Anaphylactic shock due to food (shellfish) -Testing was positive to shrimp and lobster, but it was small enough to do a food challenge. -Make an appointment in 2 to 4 weeks for a food challenge in the office. -You will need to bring shrimp or lobster with you. -No shellfish until we do this challenge!  2. Return in about 4 weeks (around 06/17/2020) for Shelby Hodges.    Subjective:   Shelby Hodges is a 49 y.o. female presenting today for evaluation of  Chief Complaint  Patient presents with  . Allergy Testing    Shellfish    Shelby Hodges has a history of the following: Patient Active Problem List   Diagnosis Date Noted  . 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 Pelvic mass in female 01/08/2013  . ABDOMINAL PAIN-PERIUMBILICAL 41/32/4401  . MIGRAINE HEADACHE 11/03/2008  . ABDOMINAL PAIN 11/03/2008    History obtained from: chart review and patient.  Shelby Hodges was referred by Caren Macadam, MD.     Shelby Hodges is a 49 y.o. female presenting for an evaluation of a shellfish allergy.  She tried some crab and lobster around years ago in the 1980s. She had throat swelling. She was itching over her mouth and chest. She did take something for it, but she is unsure what she took. She did not have to go to the ED for these symptoms. She does communicate with the waiting staff about her allergy. She does not have an up to date EpiPen.  She does not avoid any other foods. She tolerates all of the other major food allergens without adverse event. Her husband is allergic to pollen. She does not have brothers or sisters. She does not have allergic rhinitis symptoms at all. She does not have eczema at all.   Otherwise, there is no history of  other atopic diseases, including asthma, drug allergies, environmental allergies, stinging insect allergies, eczema, urticaria or contact dermatitis. There is no significant infectious history. Vaccinations are up to date.    Past Medical History: Patient Active Problem List   Diagnosis Date Noted  . 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 Pelvic mass in female 01/08/2013  . ABDOMINAL PAIN-PERIUMBILICAL 02/72/5366  . MIGRAINE HEADACHE 11/03/2008  . ABDOMINAL PAIN 11/03/2008    Medication List:  Allergies as of 05/20/2020   No Known Allergies     Medication List       Accurate as of May 20, 2020  5:55 PM. If you have any questions, ask your nurse or doctor.        alum & mag hydroxide-simeth 200-200-20 MG/5ML suspension Commonly known as: MAALOX/MYLANTA Take by mouth every 6 (six) hours as needed for indigestion or heartburn.   lisinopril-hydrochlorothiazide 10-12.5 MG tablet Commonly known as: ZESTORETIC Take 1 tablet by mouth daily.   naproxen 500 MG tablet Commonly known as: NAPROSYN Take 1 tablet (500 mg total) by mouth 2 (two) times daily.       Birth History: non-contributory  Developmental History: non-contributory  Past Surgical History: Past Surgical History:  Procedure Laterality Date  . CESAREAN SECTION  2004  . EXPLORATORY LAPAROTOMY    . LAPAROTOMY Left 07/04/2013   Procedure: EXPLORATORY LAPAROTOMY LEFT OVARIAN CYSTECTOMY and Fibroid removal  with lysis of Adhesions;  Surgeon: Shelby Crocker, MD;  Location: [facility];  Service: Gynecology;  Laterality: Left;  . [place]  . OVARIAN CYST REMOVAL Left   . UTERINE FIBROID SURGERY       Family History: Family History  Problem Relation Age of Onset  . Cancer Mother   . Diabetes Maternal Grandmother      Social History: Shelby Hodges lives at home with her husband and 18 year old son. She recently started working at [employer]. She does have to talk to angry people occasionally, but the majority  of the time they are friendly.  They live in a house that is 49 years old.  There is carpeting throughout the home.  They have electric heating and central cooling.  There is a cat inside of the home.  They do have dust mite covers on the bed as well as the pillows.  There is no tobacco exposure.  There are no chemicals, fumes, or dust.   Review of Systems  Constitutional: Negative.  Negative for chills, fever, malaise/fatigue and weight loss.  HENT: Negative.  Negative for congestion, ear discharge, ear pain and sore throat.   Eyes: Negative for pain, discharge and redness.  Respiratory: Negative for cough, sputum production, shortness of breath and wheezing.   Cardiovascular: Negative.  Negative for chest pain and palpitations.  Gastrointestinal: Negative for abdominal pain, constipation, diarrhea, heartburn, nausea and vomiting.  Skin: Negative.  Negative for itching and rash.  Neurological: Negative for dizziness and headaches.  Endo/Heme/Allergies: Negative for environmental allergies. Does not bruise/bleed easily.       Objective:   Blood pressure (!) 146/90, pulse 78, temperature 98.5 F (36.9 C), temperature source Temporal, resp. rate 18, height 5\' 5"  (1.651 m), weight 276 lb (125.2 kg), SpO2 97 %. Body mass index is 45.93 kg/m.   Physical Exam:   Physical Exam Constitutional:      Appearance: She is well-developed.  HENT:     Head: Normocephalic and atraumatic.     Right Ear: Tympanic membrane, ear canal and external ear normal.     Left Ear: Tympanic membrane, ear canal and external ear normal.     Nose: No nasal deformity, septal deviation, mucosal edema or rhinorrhea.     Right Turbinates: Enlarged and swollen.     Left Turbinates: Enlarged and swollen.     Right Sinus: No maxillary sinus tenderness or frontal sinus tenderness.     Left Sinus: No maxillary sinus tenderness or frontal sinus tenderness.     Mouth/Throat:     Mouth: Mucous membranes are not pale and  not dry.     Pharynx: Uvula midline.  Eyes:     General: Allergic shiner present.        Right eye: No discharge.        Left eye: No discharge.     Conjunctiva/sclera: Conjunctivae normal.     Right eye: Right conjunctiva is not injected. No chemosis.    Left eye: Left conjunctiva is not injected. No chemosis.    Pupils: Pupils are equal, round, and reactive to light.  Cardiovascular:     Rate and Rhythm: Normal rate and regular rhythm.     Heart sounds: Normal heart sounds.  Pulmonary:     Effort: Pulmonary effort is normal. No tachypnea, accessory muscle usage or respiratory distress.     Breath sounds: Normal breath sounds. No wheezing, rhonchi or rales.     Comments: Moving air well in all lung fields.  Chest:     Chest wall: No tenderness.  Lymphadenopathy:     Cervical: No cervical adenopathy.  Skin:    General: Skin is warm.     Capillary Refill: Capillary refill takes less than 2 seconds.     Coloration: Skin is not pale.     Findings: No abrasion, erythema, petechiae or rash. Rash is not papular, urticarial or vesicular.  Neurological:     Mental Status: She is alert.  Psychiatric:        Behavior: Behavior is cooperative.      Diagnostic studies:     Allergy Studies:     Food Adult Perc - 05/20/20 1500    Time Antigen Placed 1526    Allergen Manufacturer Lavella Hammock    Location Arm    Number of allergen test 8     Control-buffer 50% Glycerol Negative    Control-Histamine 1 mg/ml 2+    8. Shellfish Mix Negative    25. Shrimp --   2x5   26. Crab Negative    27. Lobster --   4x5   28. Oyster Negative    29. Scallops Negative           Allergy testing results were read and interpreted by myself, documented by clinical staff.         Salvatore Marvel, MD [facility]

## 2020-05-20 NOTE — Patient Instructions (Addendum)
1. Anaphylactic shock due to food (shellfish) -Testing was positive to shrimp and lobster, but it was small enough to do a food challenge. -Make an appointment in 2 to 4 weeks for a food challenge in the office. -You will need to bring shrimp or lobster with you. -No shellfish until we do this challenge!  2. Return in about 4 weeks (around 06/17/2020) for [place].    Please inform us of any Emergency Department visits, hospitalizations, or changes in symptoms. Call us before going to the ED for breathing or allergy symptoms since we might be able to fit you in for a sick visit. Feel free to contact us anytime with any questions, problems, or concerns.  It was a pleasure to meet you and your family today!  Websites that have reliable patient information: 1. American Academy of Asthma, Allergy, and Immunology: www.aaaai.org 2. Food Allergy Research and Education (FARE): foodallergy.org 3. Mothers of Asthmatics: http://www.asthmacommunitynetwork.org 4. American College of Allergy, Asthma, and Immunology: www.acaai.org   COVID-19 Vaccine Information can be found at: ShippingScam.co.uk For questions related to vaccine distribution or appointments, please email vaccine@[facility] .com or call [phone].     "Like" [place] on Facebook and Instagram for our latest updates!        Make sure you are registered to vote! If you have moved or changed any of your contact information, you will need to get this updated before voting!  In some cases, you MAY be able to register to vote online: [email or web address]

## 2020-07-06 ENCOUNTER — Encounter: Payer: 59 | Admitting: Allergy & Immunology

## 2020-10-12 ENCOUNTER — Other Ambulatory Visit (HOSPITAL_COMMUNITY): Payer: Self-pay | Admitting: Oncology

## 2020-10-12 ENCOUNTER — Encounter: Payer: Self-pay | Admitting: Oncology

## 2020-10-12 ENCOUNTER — Telehealth (HOSPITAL_COMMUNITY): Payer: Self-pay | Admitting: Oncology

## 2020-10-12 DIAGNOSIS — U071 COVID-19: Secondary | ICD-10-CM

## 2020-10-12 NOTE — Telephone Encounter (Signed)
I connected by phone Mrs. Thorpe-Smith  with to discuss the potential use of an new treatment for mild to moderate COVID-19 viral infection in non-hospitalized patients.   This patient is a age/sex that meets the FDA criteria for Emergency Use Authorization of casirivimab\imdevimab.  Has a (+) direct SARS-CoV-2 viral test result 1. Has mild or moderate COVID-19  2. Is ? 49 years of age and weighs ? 40 kg 3. Is NOT hospitalized due to COVID-19 4. Is NOT requiring oxygen therapy or requiring an increase in baseline oxygen flow rate due to COVID-19 5. Is within 10 days of symptom onset 6. Has at least one of the high risk factor(s) for progression to severe COVID-19 and/or hospitalization as defined in EUA. Specific high risk criteria :  Past Medical History:  Diagnosis Date  . Anemia   . Hypertension   ? Obesity  ? Unvaccinated  ?  Symptom onset-10/06/20   I have spoken and communicated the following to the patient or parent/caregiver:   1. FDA has authorized the emergency use of casirivimab\imdevimab for the treatment of mild to moderate COVID-19 in adults and pediatric patients with positive results of direct SARS-CoV-2 viral testing who are 49 years of age and older weighing at least 40 kg, and who are at high risk for progressing to severe COVID-19 and/or hospitalization.   2. The significant known and potential risks and benefits of casirivimab\imdevimab, and the extent to which such potential risks and benefits are unknown.   3. Information on available alternative treatments and the risks and benefits of those alternatives, including clinical trials.   4. Patients treated with casirivimab\imdevimab should continue to self-isolate and use infection control measures (e.g., wear mask, isolate, social distance, avoid sharing personal items, clean and disinfect "high touch" surfaces, and frequent handwashing) according to CDC guidelines.    5. The patient or parent/caregiver has the  option to accept or refuse casirivimab\imdevimab .   After reviewing this information with the patient, The patient agreed to proceed with receiving casirivimab\imdevimab infusion and will be provided a copy of the Fact sheet prior to receiving the infusion.Mignon Pine, AGNP-C [phone] (Infusion Center Hotline)

## 2020-10-13 ENCOUNTER — Ambulatory Visit (HOSPITAL_COMMUNITY)
Admission: RE | Admit: 2020-10-13 | Discharge: 2020-10-13 | Disposition: A | Discharge status: Home / Self Care | Payer: 59 | Source: Ambulatory Visit | Attending: Pulmonary Disease | Admitting: Pulmonary Disease

## 2020-10-13 DIAGNOSIS — U071 COVID-19: Secondary | ICD-10-CM | POA: Insufficient documentation

## 2020-10-13 MED ORDER — EPINEPHRINE 0.3 MG/0.3ML IJ SOAJ: 0.3000 mg | Freq: Once | INTRAMUSCULAR | Status: DC | PRN

## 2020-10-13 MED ORDER — ALBUTEROL SULFATE HFA 108 (90 BASE) MCG/ACT IN AERS: 2.0000 | INHALATION_SPRAY | Freq: Once | RESPIRATORY_TRACT | Status: DC | PRN

## 2020-10-13 MED ORDER — SODIUM CHLORIDE 0.9 % IV SOLN: INTRAVENOUS | Status: DC | PRN

## 2020-10-13 MED ORDER — METHYLPREDNISOLONE SODIUM SUCC 125 MG IJ SOLR: 125.0000 mg | Freq: Once | INTRAMUSCULAR | Status: DC | PRN

## 2020-10-13 MED ORDER — SODIUM CHLORIDE 0.9 % IV SOLN: Freq: Once | INTRAVENOUS | Status: AC

## 2020-10-13 MED ORDER — FAMOTIDINE IN NACL 20-0.9 MG/50ML-% IV SOLN: 20.0000 mg | Freq: Once | INTRAVENOUS | Status: DC | PRN

## 2020-10-13 MED ORDER — DIPHENHYDRAMINE HCL 50 MG/ML IJ SOLN: 50.0000 mg | Freq: Once | INTRAMUSCULAR | Status: DC | PRN

## 2020-10-13 NOTE — Progress Notes (Signed)
Patient reviewed Fact Sheet for Patients, Parents, and Caregivers for Emergency Use Authorization (EUA) of Casirivimab and Imdevimab for the Treatment of Coronavirus. Patient also reviewed and is agreeable to the estimated cost of treatment. Patient is agreeable to proceed.   

## 2020-10-13 NOTE — Progress Notes (Signed)
  Diagnosis: COVID-19  Physician:Dr Wright   Procedure: [facility] Med: casirivimab\imdevimab infusion - Provided patient with casirivimab\imdevimab fact sheet for patients, parents and caregivers prior to infusion.  Complications: No immediate complications noted.  Discharge: Discharged home   Shelby Hodges W 10/13/2020  

## 2020-10-13 NOTE — Discharge Instructions (Signed)
10 Things You Can Do to Manage Your COVID-19 Symptoms at Home If you have possible or confirmed COVID-19: 1. Stay home from work and school. And stay away from other public places. If you must go out, avoid using any kind of public transportation, ridesharing, or taxis. 2. Monitor your symptoms carefully. If your symptoms get worse, call your healthcare provider immediately. 3. Get rest and stay hydrated. 4. If you have a medical appointment, call the healthcare provider ahead of time and tell them that you have or may have COVID-19. 5. For medical emergencies, call 911 and notify the dispatch personnel that you have or may have COVID-19. 6. Cover your cough and sneezes with a tissue or use the inside of your elbow. 7. Wash your hands often with soap and water for at least 20 seconds or clean your hands with an alcohol-based hand sanitizer that contains at least 60% alcohol. 8. As much as possible, stay in a specific room and away from other people in your home. Also, you should use a separate bathroom, if available. If you need to be around other people in or outside of the home, wear a mask. 9. Avoid sharing personal items with other people in your household, like dishes, towels, and bedding. 10. Clean all surfaces that are touched often, like counters, tabletops, and doorknobs. Use household cleaning sprays or wipes according to the label instructions. cdc.gov/coronavirus 04/16/2019 This information is not intended to replace advice given to you by your health care provider. Make sure you discuss any questions you have with your health care provider. Document Revised: 09/18/2019 Document Reviewed: 09/18/2019 Elsevier Patient Education  2020 Elsevier Inc. What types of side effects do monoclonal antibody drugs cause?  Common side effects  In general, the more common side effects caused by monoclonal antibody drugs include: . Allergic reactions, such as hives or itching . Flu-like signs and  symptoms, including chills, fatigue, fever, and muscle aches and pains . Nausea, vomiting . Diarrhea . Skin rashes . Low blood pressure   The CDC is recommending patients who receive monoclonal antibody treatments wait at least 90 days before being vaccinated.  Currently, there are no data on the safety and efficacy of mRNA COVID-19 vaccines in persons who received monoclonal antibodies or convalescent plasma as part of COVID-19 treatment. Based on the estimated half-life of such therapies as well as evidence suggesting that reinfection is uncommon in the 90 days after initial infection, vaccination should be deferred for at least 90 days, as a precautionary measure until additional information becomes available, to avoid interference of the antibody treatment with vaccine-induced immune responses. If you have any questions or concerns after the infusion please call the Advanced Practice Provider on call at [phone]. This number is ONLY intended for your use regarding questions or concerns about the infusion post-treatment side-effects.  Please do not provide this number to others for use. For return to work notes please contact your primary care provider.   If someone you know is interested in receiving treatment please have them call the COVID hotline at [phone].   

## 2024-05-07 ENCOUNTER — Other Ambulatory Visit: Payer: Self-pay | Admitting: Medical Genetics

## 2024-05-20 ENCOUNTER — Other Ambulatory Visit

## 2024-05-20 DIAGNOSIS — Z006 Encounter for examination for normal comparison and control in clinical research program: Secondary | ICD-10-CM

## 2024-05-30 LAB — GENECONNECT MOLECULAR SCREEN: Genetic Analysis Overall Interpretation: NEGATIVE
